# Patient Record
Sex: Female | Born: 1945 | Race: White | Hispanic: No | Marital: Married | Smoking: Former smoker
Health system: Southern US, Community
[De-identification: ages and names within clinical notes are randomized; demographics above are authoritative.]

## PROBLEM LIST (undated history)

## (undated) DIAGNOSIS — K219 Gastro-esophageal reflux disease without esophagitis: Secondary | ICD-10-CM

## (undated) DIAGNOSIS — I2510 Atherosclerotic heart disease of native coronary artery without angina pectoris: Secondary | ICD-10-CM

## (undated) DIAGNOSIS — T7840XA Allergy, unspecified, initial encounter: Secondary | ICD-10-CM

## (undated) DIAGNOSIS — K760 Fatty (change of) liver, not elsewhere classified: Secondary | ICD-10-CM

## (undated) DIAGNOSIS — R42 Dizziness and giddiness: Secondary | ICD-10-CM

## (undated) DIAGNOSIS — R079 Chest pain, unspecified: Secondary | ICD-10-CM

## (undated) DIAGNOSIS — H269 Unspecified cataract: Secondary | ICD-10-CM

## (undated) DIAGNOSIS — E785 Hyperlipidemia, unspecified: Secondary | ICD-10-CM

## (undated) DIAGNOSIS — K635 Polyp of colon: Secondary | ICD-10-CM

## (undated) DIAGNOSIS — E119 Type 2 diabetes mellitus without complications: Secondary | ICD-10-CM

## (undated) DIAGNOSIS — I10 Essential (primary) hypertension: Secondary | ICD-10-CM

## (undated) HISTORY — PX: CHOLECYSTECTOMY: SHX55

## (undated) HISTORY — DX: Essential (primary) hypertension: I10

## (undated) HISTORY — PX: BREAST EXCISIONAL BIOPSY: SUR124

## (undated) HISTORY — DX: Gastro-esophageal reflux disease without esophagitis: K21.9

## (undated) HISTORY — PX: APPENDECTOMY: SHX54

## (undated) HISTORY — PX: THYROIDECTOMY, PARTIAL: SHX18

## (undated) HISTORY — DX: Dizziness and giddiness: R42

## (undated) HISTORY — DX: Type 2 diabetes mellitus without complications: E11.9

## (undated) HISTORY — DX: Atherosclerotic heart disease of native coronary artery without angina pectoris: I25.10

## (undated) HISTORY — DX: Hyperlipidemia, unspecified: E78.5

## (undated) HISTORY — PX: ABDOMINAL HYSTERECTOMY: SHX81

## (undated) HISTORY — DX: Polyp of colon: K63.5

## (undated) HISTORY — DX: Fatty (change of) liver, not elsewhere classified: K76.0

## (undated) HISTORY — PX: COLONOSCOPY: SHX174

## (undated) HISTORY — DX: Unspecified cataract: H26.9

## (undated) HISTORY — PX: TONSILLECTOMY: SUR1361

## (undated) HISTORY — DX: Allergy, unspecified, initial encounter: T78.40XA

## (undated) HISTORY — DX: Chest pain, unspecified: R07.9

---

## 1998-09-03 ENCOUNTER — Other Ambulatory Visit: Admission: RE | Admit: 1998-09-03 | Discharge: 1998-09-03 | Payer: Self-pay | Admitting: *Deleted

## 1999-09-23 ENCOUNTER — Other Ambulatory Visit: Admission: RE | Admit: 1999-09-23 | Discharge: 1999-09-23 | Payer: Self-pay | Admitting: *Deleted

## 1999-11-01 ENCOUNTER — Encounter: Admission: RE | Admit: 1999-11-01 | Discharge: 1999-11-01 | Payer: Self-pay | Admitting: *Deleted

## 2000-06-14 ENCOUNTER — Ambulatory Visit (HOSPITAL_COMMUNITY): Admission: RE | Admit: 2000-06-14 | Discharge: 2000-06-14 | Payer: Self-pay | Admitting: *Deleted

## 2000-07-11 ENCOUNTER — Encounter: Payer: Self-pay | Admitting: General Surgery

## 2000-07-14 ENCOUNTER — Observation Stay (HOSPITAL_COMMUNITY): Admission: RE | Admit: 2000-07-14 | Discharge: 2000-07-15 | Payer: Self-pay | Admitting: General Surgery

## 2000-07-14 ENCOUNTER — Encounter (INDEPENDENT_AMBULATORY_CARE_PROVIDER_SITE_OTHER): Payer: Self-pay | Admitting: Specialist

## 2001-01-16 ENCOUNTER — Encounter: Admission: RE | Admit: 2001-01-16 | Discharge: 2001-01-16 | Payer: Self-pay | Admitting: Internal Medicine

## 2001-01-16 ENCOUNTER — Encounter: Payer: Self-pay | Admitting: Internal Medicine

## 2001-09-19 ENCOUNTER — Other Ambulatory Visit: Admission: RE | Admit: 2001-09-19 | Discharge: 2001-09-19 | Payer: Self-pay | Admitting: Family Medicine

## 2001-09-25 ENCOUNTER — Encounter: Admission: RE | Admit: 2001-09-25 | Discharge: 2001-09-25 | Payer: Self-pay | Admitting: Family Medicine

## 2001-09-25 ENCOUNTER — Encounter: Payer: Self-pay | Admitting: Family Medicine

## 2002-01-17 ENCOUNTER — Encounter: Admission: RE | Admit: 2002-01-17 | Discharge: 2002-01-17 | Payer: Self-pay | Admitting: Family Medicine

## 2002-01-17 ENCOUNTER — Encounter: Payer: Self-pay | Admitting: Family Medicine

## 2003-01-20 ENCOUNTER — Encounter: Payer: Self-pay | Admitting: Family Medicine

## 2003-01-20 ENCOUNTER — Encounter: Admission: RE | Admit: 2003-01-20 | Discharge: 2003-01-20 | Payer: Self-pay | Admitting: Family Medicine

## 2003-03-16 ENCOUNTER — Encounter: Admission: RE | Admit: 2003-03-16 | Discharge: 2003-03-16 | Payer: Self-pay

## 2003-06-30 ENCOUNTER — Ambulatory Visit (HOSPITAL_COMMUNITY): Admission: RE | Admit: 2003-06-30 | Discharge: 2003-06-30 | Payer: Self-pay | Admitting: Gastroenterology

## 2004-01-22 ENCOUNTER — Encounter: Admission: RE | Admit: 2004-01-22 | Discharge: 2004-01-22 | Payer: Self-pay | Admitting: Family Medicine

## 2004-02-03 ENCOUNTER — Encounter: Admission: RE | Admit: 2004-02-03 | Discharge: 2004-05-03 | Payer: Self-pay | Admitting: Family Medicine

## 2004-06-03 ENCOUNTER — Encounter: Admission: RE | Admit: 2004-06-03 | Discharge: 2004-06-03 | Payer: Self-pay | Admitting: Family Medicine

## 2004-09-01 ENCOUNTER — Encounter: Admission: RE | Admit: 2004-09-01 | Discharge: 2004-09-01 | Payer: Self-pay | Admitting: Family Medicine

## 2005-01-27 ENCOUNTER — Encounter: Admission: RE | Admit: 2005-01-27 | Discharge: 2005-01-27 | Payer: Self-pay | Admitting: Family Medicine

## 2005-09-10 ENCOUNTER — Encounter: Admission: RE | Admit: 2005-09-10 | Discharge: 2005-09-10 | Payer: Self-pay | Admitting: Family Medicine

## 2005-12-15 ENCOUNTER — Encounter: Admission: RE | Admit: 2005-12-15 | Discharge: 2005-12-15 | Payer: Self-pay | Admitting: Family Medicine

## 2005-12-23 ENCOUNTER — Encounter: Admission: RE | Admit: 2005-12-23 | Discharge: 2005-12-23 | Payer: Self-pay | Admitting: Family Medicine

## 2006-01-05 ENCOUNTER — Encounter: Admission: RE | Admit: 2006-01-05 | Discharge: 2006-01-05 | Payer: Self-pay | Admitting: Family Medicine

## 2006-01-26 ENCOUNTER — Other Ambulatory Visit: Admission: RE | Admit: 2006-01-26 | Discharge: 2006-01-26 | Payer: Self-pay | Admitting: Obstetrics and Gynecology

## 2006-07-10 ENCOUNTER — Ambulatory Visit (HOSPITAL_COMMUNITY): Admission: RE | Admit: 2006-07-10 | Discharge: 2006-07-10 | Payer: Self-pay | Admitting: Obstetrics and Gynecology

## 2007-01-08 ENCOUNTER — Encounter: Admission: RE | Admit: 2007-01-08 | Discharge: 2007-01-08 | Payer: Self-pay | Admitting: Family Medicine

## 2007-08-10 ENCOUNTER — Ambulatory Visit: Payer: Self-pay | Admitting: Internal Medicine

## 2007-10-11 ENCOUNTER — Ambulatory Visit: Payer: Self-pay | Admitting: Internal Medicine

## 2007-11-15 ENCOUNTER — Ambulatory Visit: Payer: Self-pay | Admitting: Internal Medicine

## 2008-01-14 ENCOUNTER — Encounter: Admission: RE | Admit: 2008-01-14 | Discharge: 2008-01-14 | Payer: Self-pay | Admitting: Family Medicine

## 2008-01-15 DIAGNOSIS — E785 Hyperlipidemia, unspecified: Secondary | ICD-10-CM | POA: Insufficient documentation

## 2008-01-15 DIAGNOSIS — E119 Type 2 diabetes mellitus without complications: Secondary | ICD-10-CM | POA: Insufficient documentation

## 2008-01-15 DIAGNOSIS — I10 Essential (primary) hypertension: Secondary | ICD-10-CM | POA: Insufficient documentation

## 2009-02-05 ENCOUNTER — Encounter: Admission: RE | Admit: 2009-02-05 | Discharge: 2009-02-05 | Payer: Self-pay | Admitting: Family Medicine

## 2009-09-30 ENCOUNTER — Ambulatory Visit: Payer: Self-pay | Admitting: Internal Medicine

## 2009-09-30 DIAGNOSIS — R198 Other specified symptoms and signs involving the digestive system and abdomen: Secondary | ICD-10-CM | POA: Insufficient documentation

## 2009-09-30 DIAGNOSIS — R1084 Generalized abdominal pain: Secondary | ICD-10-CM | POA: Insufficient documentation

## 2009-09-30 DIAGNOSIS — R1013 Epigastric pain: Secondary | ICD-10-CM | POA: Insufficient documentation

## 2010-02-08 ENCOUNTER — Encounter: Admission: RE | Admit: 2010-02-08 | Discharge: 2010-02-08 | Payer: Self-pay | Admitting: Family Medicine

## 2011-01-10 DIAGNOSIS — R079 Chest pain, unspecified: Secondary | ICD-10-CM

## 2011-01-10 HISTORY — DX: Chest pain, unspecified: R07.9

## 2011-02-21 ENCOUNTER — Other Ambulatory Visit: Payer: Self-pay | Admitting: Internal Medicine

## 2011-02-21 DIAGNOSIS — Z1231 Encounter for screening mammogram for malignant neoplasm of breast: Secondary | ICD-10-CM

## 2011-03-02 ENCOUNTER — Ambulatory Visit
Admission: RE | Admit: 2011-03-02 | Discharge: 2011-03-02 | Disposition: A | Discharge status: Home / Self Care | Payer: Medicare Other | Source: Ambulatory Visit | Attending: Internal Medicine | Admitting: Internal Medicine

## 2011-03-02 DIAGNOSIS — Z1231 Encounter for screening mammogram for malignant neoplasm of breast: Secondary | ICD-10-CM

## 2011-04-12 NOTE — Assessment & Plan Note (Signed)
[facility]                         GASTROENTEROLOGY OFFICE NOTE   NAME:Harmon, Rebecca KNOBLOCH                   MRN:          [id number]  DATE:10/11/2007                            DOB:          1946-04-03    HISTORY:  Rebecca Harmon presents today in followup.  She is a 65 year old  with a history of hypertension, dyslipidemia, and glucose intolerance.  Also a history of multiple abdominal surgeries.  She was evaluated in  the office August 10, 2007 as a new patient regarding abdominal pain.  Her pain was chronic and felt to be functional with significant  component of chronic constipation.  MiraLax for constipation was  prescribed.  As well, she was to have colonoscopy to evaluate and also  to provide neoplasia screening.  The patient states she took just 2  doses of MiraLax and then abandoned the therapy, stating it may have  given her some gas.  She did not schedule colonoscopy.  She scheduled an  office visit for today.  The patient's symptoms are essentially  unchanged from her last office visit.  No real change.   CURRENT MEDICATIONS:  Metoprolol.  Ramipril.  Hydrochlorothiazide.  Estradiol.  Aspirin.  Protegra.  Vitamin.  Fish oil.   PHYSICAL EXAM:  Well-appearing female in no acute distress.  Blood pressure 132/70, heart rate 68, weight 179 pounds.  ABDOMEN:  Soft without mass or significant tenderness.  Good bowel  sounds heard.   IMPRESSION:  1. Chronic abdominal pain, Harmon likely functional.  2. Chronic constipation.  3. Multiple abdominal surgeries.   RECOMMENDATIONS:  1. The patient is asked to resume MiraLax on a more regular basis and      titrate to need.  She is agreeable.  Improved bowel habits may      improve some of her abdominal complaints.  2. Again discussed the risks and benefits of colonoscopy.  She is      again agreeable to proceed.  The nature of the procedure, as well      as the risks, benefits, and alternatives  are discussed.  She      understood.  3. Ongoing general medical care with Dr. Elisabeth Harmon.     Wilhemina Bonito. Marina Goodell, MD  Electronically Signed    JNP/MedQ  DD: 10/12/2007  DT: 10/12/2007  Job #: [id number]   cc:   Rebecca Harmon, D.O.

## 2011-04-12 NOTE — Assessment & Plan Note (Signed)
[facility]                         GASTROENTEROLOGY OFFICE NOTE   NAME:Rebecca Harmon, Rebecca Harmon                   MRN:          [id number]  DATE:08/10/2007                            DOB:          04/20/1946    REFERRING PHYSICIAN:  Lovenia Kim, D.O.   REASON FOR CONSULTATION:  Abdominal pain.   HISTORY:  This is a 65 year old white female with a history of  hypertension, diabetes, and unspecified thyroid problems.  She has also  had multiple surgeries.  She presents today regarding chronic abdominal  pain of one to two years' duration.  The patient reports chronic low  abdominal discomfort, actually pelvic discomfort.  It seems to radiate  between the hips.  She states the discomfort is worse with constipation.  The discomfort lasts no longer than 5 minutes.  For this discomfort, she  underwent laparoscopy with lysis of adhesions approximately one year  ago.  This did not help.  Her discomfort can occur on a daily basis for  a week or so, or she can asymptomatic for one or two weeks.  Overall,  she thinks symptoms are worse.  She also has upper abdominal  discomfort  in the epigastric region, but she states it seems to be worse on an  empty stomach.  She did undergo an upper endoscopy with Dr. Charna Elizabeth  January 30, 2006.  This was said to be normal, except for mild antral  gastritis.  The patient has some nausea, though denies vomiting,  diarrhea, melena, change in appetite or change in weight.  She does have  bloating.  She has been on Nexium in the past without improvement of  symptoms.  Recent CT scan of the abdomen and pelvis with and without  contrast performed June 04, 2007, was unremarkable.   PAST MEDICAL HISTORY:  1. Hypertension.  2. Diabetes (diet controlled).  3. Hypertriglyceridemia.   PAST SURGICAL HISTORY:  1. Partial thyroidectomy.  2. Cholecystectomy.  3. Hysterectomy.  4. Tubal ligation.  5. Breast biopsy for benign  disease.  6. Appendectomy.  7. Repair of detached retina.   ALLERGIES:  NO KNOWN DRUG ALLERGIES.   CURRENT MEDICATIONS:  1. Metoprolol 100 mg daily.  2. Ramipril 10 mg daily.  3. Hydrochlorothiazide 12.5 mg daily.   FAMILY HISTORY:  Negative for gastrointestinal malignancy.  Mother with  diabetes.   SOCIAL HISTORY:  The patient is married with two sons.  She lives with  her husband.  She has a 12th grade education.  She works for [employer], does not smoke or use alcohol.   REVIEW OF SYSTEMS:  Per diagnostic evaluation form.   PHYSICAL EXAMINATION:  GENERAL:  Well-appearing female, no acute  distress.  VITAL SIGNS:  Blood pressure is 144/78, heart rate 78, weight is 178.4  pounds.  She is 5 feet 3 inches in height.  HEENT:  Sclerae anicteric, conjunctivae are pink.  Oral mucosa is  intact, no adenopathy.  LUNGS:  Clear to auscultation and percussion.  HEART:  Regular.  ABDOMEN:  Soft without significant tenderness.  There is no mass or  hernia.  Previous surgical incisions are well healed.  RECTAL:  Deferred.  EXTREMITIES:  Without edema.   IMPRESSION:  1. Chronic lower abdominal/pelvic pain of uncertain etiology.  Suspect      functional.  Question irritable bowel, question adhesions.  2. Intermittent epigastric pain, status post cholecystectomy.  No      improvement on proton pump inhibitor.  Suspect functional.  3. Chronic constipation.  4. General medical problems and prior surgeries as discussed.   RECOMMENDATIONS:  1. MiraLax for constipation.  2. Colonoscopy to evaluate pain and provide neoplasia screening, as it      has been close to 10 years since her last exam.  The nature of the      procedure, as well as the risks, benefits and alternatives have      been reviewed.  She understood and agreed to proceed.  3. Consider antispasmodics.  4. Ongoing general medical care with Dr. Elisabeth Most.     Wilhemina Bonito. Marina Goodell, MD  Electronically  Signed    JNP/MedQ  DD: 08/13/2007  DT: 08/14/2007  Job #: [id number]   cc:   Lovenia Kim, D.O.

## 2011-04-15 NOTE — Discharge Summary (Signed)
[facility]  Patient:    KYSA, CALAIS                   MRN: [id number] Adm. Date:  [id number] Disc. Date: [id number] Attending:  Caleen Essex                           Discharge Summary  HISTORY OF PRESENT ILLNESS AND HOSPITAL COURSE:  For details of Ms. Coltranes history and physical, please see her dictated H&P.  In summary, she is a 65 year old lady with symptomatic cholelithiasis who was brought to the operating room on July 14, 2000, for a laparoscopic cholecystectomy.  She tolerated this procedure well.  Postoperatively, she was able to tolerate a diet.  She was ambulating and urinating without difficulty.  By the next morning, she was ready for discharge to home.  DISCHARGE MEDICATIONS:  She is to resume her home medicines as well as taking Vicodin 1-2 p.o. q.4-6h. p.r.n. for pain.  ACTIVITY:  No heavy lifting for the next several weeks.  DIET:  Regular.  CONDITION ON DISCHARGE:  Stable.  FOLLOW-UP:  She is to call for a follow-up appointment in the next two weeks. D:  07/16/00 TD:  07/17/00 Job: [id number]

## 2011-04-15 NOTE — Op Note (Signed)
Rebecca Harmon, SUMNERS            ACCOUNT NO.:  [id number]   MEDICAL RECORD NO.:  [id number]          PATIENT TYPE:  AMB   LOCATION:  SDC                           FACILITY:  [facility]   PHYSICIAN:  Dois Davenport A. Rivard, M.D. DATE OF BIRTH:  30-Aug-1946   DATE OF PROCEDURE:  07/10/2006  DATE OF DISCHARGE:                                 OPERATIVE REPORT   PREOPERATIVE DIAGNOSIS:  Pelvic pain.   POSTOPERATIVE DIAGNOSIS:  Pelvic pain with pelvic adhesions.   PROCEDURE:  Laparoscopy with lysis of adhesions.   SURGEON:  Dr. Estanislado Pandy.   No assistant.   ESTIMATED BLOOD LOSS:  Minimal.   No specimen.   PROCEDURE:  After being informed of the planned procedure with possible  complications including bleeding, infection, injury to other organs and need  for laparotomy, informed consent is obtained.  The patient is taken to OR  #3, given general anesthesia with endotracheal intubation.  She is placed in  the lithotomy position, prepped and draped in a sterile fashion.  Her  bladder is emptied with an in-and-out red rubber catheter.  Pelvic exam  reveals absence of uterus and two normal adnexa.  No pelvic masses were  noted.  We infiltrated the umbilical area using 5 mL of Marcaine 0.25, and  perform a semi-elliptical incision which was brought down bluntly to the  fascia.  The fascia is identified and grasped with Kocher forceps and opened  with scissors.  Peritoneum is entered bluntly with hemostat clamp.  A  pursestring suture of 0-0 Vicryl is placed on the fascia and a Hasson trocar  is inserted easily, tied with a pursestring suture.  This allows [place] to  achieve a pneumoperitoneum with CO2 at a maximum pressure of 15 mmHg.  A 5-  mm trocar is then inserted suprapubically, after infiltrating with 3 mL of  Marcaine 0.25, and this was done overlooking the previous Pfannenstiel  incision.   OBSERVATION:  The cul-de-sac is completely obliterated by pelvic adhesions  with pericolic fat.  The right  tube is identified with a small hydrosalpinx,  measuring 1.5 cm, and the right ovary is plastered to the pelvic wall and to  the pericolic fat by an adhesion process.  We note adhesion in the right  paraumbilical area, pulling on the large bowel toward the anterior abdominal  wall.  Liver is visualized and normal.  We also of note some adhesions  between the sigmoid colon and the left pelvic wall, which hinders our  ability to see the left ovary.  A right lower quadrant 5-mm trocar is  inserted under direct visualization, after infiltrating with 2 mL of  Marcaine 0.25, and we proceed with sharp dissection of the adhesions in the  right periumbilical area, using monopolar scissors and countertraction.  This is done always keeping the bowel under direct visualization, and we are  able to completely abate these adhesions.  Hemostasis is adequate.  We then  proceed with lysis of adhesions over the sigmoid colon in the left pelvic  wall, and the colon was completely freed, but unfortunately, we are unable  to free the  adnexal area due to dense adhesion and its proximity to the left  ureter.  We then proceed with lysis of adhesions, sharply again, over the  right ovary so that the right ovary is completely free.   We then irrigate profusely with warm saline and perform hemostasis with  cauterization over of the left pericolic fat.  Hemostasis is then adequate.   The 5-mm trocars are then removed, under direct visualization, and the  umbilical trocar is removed after evacuating the pneumoperitoneum.  The  pursestring suture is tied, after confirming absence of bowel loops  protruding, and the skin of all incisions is then closed with subcuticular  suture of 4-0 Vicryl.  Instrument and sponge count is complete x2.  Estimated blood loss is minimal.  The procedure is very well tolerated by  the patient who is taken to recovery room in a well and stable condition.      Crist Fat Rivard, M.D.   Electronically Signed     SAR/MEDQ  D:  07/10/2006  T:  07/10/2006  Job:  [id number]

## 2011-04-15 NOTE — Op Note (Signed)
[facility]  Patient:    Rebecca Harmon, Rebecca Harmon                   MRN: [id number] Proc. Date: 07/14/00 Adm. Date:  [id number] Disc. Date: [id number] Attending:  Caleen Essex                           Operative Report  PREOPERATIVE DIAGNOSIS:  Symptomatic cholelithiasis.  POSTOPERATIVE DIAGNOSIS:  Symptomatic cholelithiasis.  PROCEDURE:  Laparoscopic cholecystectomy.  SURGEON:  Chevis Pretty, M.D.  ASSISTANT:  Adolph Pollack, M.D.  ANESTHESIA:  General endotracheal.  DESCRIPTION OF PROCEDURE:  After informed consent was obtained, the patient was brought to the operating room and placed in the supine position on the operating room table.  After adequate induction of general anesthesia, the patients ________ was prepped with Betadine and draped in the usual sterile manner.  A transverse supraumbilical incision was made with a 15 blade knife. This was carried down to the anterior abdominal fascia bluntly using a Tresa Endo and a [employer] retractors.  Once the fascia of the anterior abdominal wall was encountered, it was incised with a 15 blade knife, and each side was grasped with the Kocher clamps.  This area was then probed bluntly with a hemostat until entry was gained to the abdominal cavity.  A 0 Vicryl pursestring suture was placed around the sole in the fascia and an Hasson cannula was inserted through the sole, and the laparoscope was placed through this.  A small upper midline incision was made with the 15 blade knife and the 10 mm port was placed through this into the abdominal cavity under direct vision using the laparoscope.  Two small transverse incisions were made laterally on the right side of the abdomen with a 15 blade knife and two small 5 mm ports were placed through these holes into the abdominal cavity, again under direct vision using the laparoscope.  A blunt retractor was placed was placed through the lateral most 5 mm port and used to  grasp the dome of the gallbladder and elevated anteriorly and superiorly.  Another blunt grasper was placed through the other lateral port and used to grasp the body and the neck of the gallbladder for lateral retraction.  A [place] dissector was placed through the 10 mm port and used to bluntly dissect the peritoneal reflection over top of the neck of the gallbladder until the cystic duct and gallbladder junction was identified.  Once this was identified, the cystic duct was dissected circumferentially in a blunt manner at this area.  Three clips were placed proximally and one distally on the cystic duct, taking care to keep the common duct medial to all this, and the cystic duct was divided between the two.  The cystic duct artery was then identified posterior to this and the two clips were placed proximally and one distally, and the cystic artery was divided between the two.  Next, a hook cautery was used to dissect sharply the gallbladder from the liver bed.  Once this was complete, the laparoscope was moved to the upper midline port, and the gallbladder grasper was placed through the Hasson cannula and used to grasp the neck of the gallbladder.  The gallbladder was then removed under direct vision through the umbilical port.  The Hasson cannula was replaced and the laparoscope was moved back to the Hasson port. The abdomen was inspected and the liver bed was found to  be hemostatic and was irrigated with saline until the _______ was clear.  All of the cannulas were then removed under direct vision.  The supraumbilical fascia was then closed with a previously placed pursestring Vicryl stitch.  The skin was closed with interrupted 4-0 Monocryl subcuticular stitches.  Benzoin and Steri-Strips were applied.  The patient tolerated the procedure well.  At the end of the case, all needle, sponge, and instrument counts were correct.  The patient was awakened and taken to the recovery room in  stable condition. DD:  09/22/00 TD:  09/23/00 Job: [id number]

## 2011-04-15 NOTE — Op Note (Signed)
NAME:  Rebecca Harmon, Rebecca Harmon                      ACCOUNT NO.:  [id number]   MEDICAL RECORD NO.:  [id number]                   PATIENT TYPE:  AMB   LOCATION:  ENDO                                 FACILITY:  [facility]   PHYSICIAN:  Anselmo Rod, M.D.               DATE OF BIRTH:  [birth year]   DATE OF PROCEDURE:  06/30/2003  DATE OF DISCHARGE:                                 OPERATIVE REPORT   PROCEDURE PERFORMED:  Screening colonoscopy.   ENDOSCOPIST:  Anselmo Rod, M.D.   INSTRUMENT USED:  Olympus video colonoscope.   INDICATION FOR PROCEDURE:  A 65 year old white female with episodic blood in  stool (small specks of blood seen on three occasions in the last five  months).  Rule out colonic polyps, masses, hemorrhoids, etc.   PREPROCEDURE PREPARATION:  Informed consent was procured from the patient.  The patient was fasted for eight hours prior to the procedure and prepped  with a bottle of magnesium citrate and a gallon of GoLYTELY the night prior  to the procedure.   PREPROCEDURE PHYSICAL:  VITAL SIGNS:  The patient had stable vital signs.  NECK:  Supple.  CHEST:  Clear to auscultation.  S1, S2 regular.  ABDOMEN:  Soft with normal bowel sounds.   DESCRIPTION OF PROCEDURE:  The patient was placed in the left lateral  decubitus position and sedated with 70 mg of Demerol and 7 mg of Versed  intravenously.  Once the patient was adequately sedate and maintained on low-  flow oxygen and continuous cardiac monitoring, the Olympus video colonoscope  was advanced from the rectum to the cecum and terminal ileum without  difficulty.  The patient had a fairly good prep.  No masses, polyps,  erosions, ulcerations, or diverticula were seen.  The appendiceal orifice  and the ileocecal valve were visualized and photographed.  The terminal  ileum appeared healthy and without lesions.  Retroflexion in the rectum  revealed no abnormalities.   IMPRESSION:  Normal colonoscopy up to the  terminal ileum.   RECOMMENDATIONS:  1. No source of bleeding has been identified at this point, and therefore     the patient has been advised to maintain a diary with regard to her     symptoms.  If she continues to see blood in the stool, an EGD and small     bowel follow-through will be considered in the future.  2.     Outpatient follow-up in the next two weeks.  3. Repeat colorectal cancer screening in the next 10 years unless the     patient develops any abnormal symptoms in the interim.                                               Anselmo Rod, M.D.  JNM/MEDQ  D:  06/30/2003  T:  06/30/2003  Job:  [id number]   cc:   Christella Noa, M.D.  [address]  [place], [place] [address]  Fax: [phone]

## 2012-02-13 ENCOUNTER — Other Ambulatory Visit: Payer: Self-pay | Admitting: Internal Medicine

## 2012-02-13 DIAGNOSIS — Z1231 Encounter for screening mammogram for malignant neoplasm of breast: Secondary | ICD-10-CM

## 2012-03-02 ENCOUNTER — Ambulatory Visit
Admission: RE | Admit: 2012-03-02 | Discharge: 2012-03-02 | Disposition: A | Discharge status: Home / Self Care | Payer: Medicare Other | Source: Ambulatory Visit | Attending: Internal Medicine | Admitting: Internal Medicine

## 2012-03-02 DIAGNOSIS — Z1231 Encounter for screening mammogram for malignant neoplasm of breast: Secondary | ICD-10-CM

## 2013-02-26 ENCOUNTER — Other Ambulatory Visit (HOSPITAL_COMMUNITY): Payer: Self-pay | Admitting: Physician Assistant

## 2013-02-26 DIAGNOSIS — E1059 Type 1 diabetes mellitus with other circulatory complications: Secondary | ICD-10-CM

## 2013-02-26 DIAGNOSIS — R079 Chest pain, unspecified: Secondary | ICD-10-CM

## 2013-02-26 DIAGNOSIS — I10 Essential (primary) hypertension: Secondary | ICD-10-CM

## 2013-03-06 ENCOUNTER — Ambulatory Visit (HOSPITAL_COMMUNITY)
Admission: RE | Admit: 2013-03-06 | Discharge: 2013-03-06 | Disposition: A | Discharge status: Home / Self Care | Payer: Medicare Other | Source: Ambulatory Visit | Attending: Physician Assistant | Admitting: Physician Assistant

## 2013-03-06 DIAGNOSIS — R5381 Other malaise: Secondary | ICD-10-CM | POA: Insufficient documentation

## 2013-03-06 DIAGNOSIS — R0609 Other forms of dyspnea: Secondary | ICD-10-CM | POA: Insufficient documentation

## 2013-03-06 DIAGNOSIS — R42 Dizziness and giddiness: Secondary | ICD-10-CM | POA: Insufficient documentation

## 2013-03-06 DIAGNOSIS — R5383 Other fatigue: Secondary | ICD-10-CM | POA: Insufficient documentation

## 2013-03-06 DIAGNOSIS — E1059 Type 1 diabetes mellitus with other circulatory complications: Secondary | ICD-10-CM

## 2013-03-06 DIAGNOSIS — I10 Essential (primary) hypertension: Secondary | ICD-10-CM

## 2013-03-06 DIAGNOSIS — R079 Chest pain, unspecified: Secondary | ICD-10-CM | POA: Insufficient documentation

## 2013-03-06 DIAGNOSIS — R002 Palpitations: Secondary | ICD-10-CM | POA: Insufficient documentation

## 2013-03-06 DIAGNOSIS — R0989 Other specified symptoms and signs involving the circulatory and respiratory systems: Secondary | ICD-10-CM | POA: Insufficient documentation

## 2013-03-06 HISTORY — DX: Dizziness and giddiness: R42

## 2013-03-06 MED ORDER — TECHNETIUM TC 99M SESTAMIBI GENERIC - CARDIOLITE
30.0000 | Freq: Once | INTRAVENOUS | Status: AC | PRN
  Administered 2013-03-06: 30 via INTRAVENOUS

## 2013-03-06 MED ORDER — TECHNETIUM TC 99M SESTAMIBI GENERIC - CARDIOLITE
10.0000 | Freq: Once | INTRAVENOUS | Status: AC | PRN
  Administered 2013-03-06: 10 via INTRAVENOUS

## 2013-03-06 NOTE — Procedures (Addendum)
 [facility] [address] [place] [address] [phone]  Cardiology Nuclear Med Study  Rebecca Harmon is a 67 y.o. female     MRN : [id number]     DOB: 1946-07-06  Procedure Date: 03/06/2013  Nuclear Med Background Indication for Stress Test:  Evaluation for Ischemia History:  NO PRIOR CARDIAC HISTORY Cardiac Risk Factors: Family History - CAD, History of Smoking, Hypertension, Lipids, NIDDM and Obesity  Symptoms:  Chest Pain, Dizziness, DOE, Fatigue, Light-Headedness and Palpitations   Nuclear Pre-Procedure Caffeine/Decaff Intake:  7:00pm NPO After: 5:00am   IV Site: R Forearm  IV 0.9% NS with Angio Cath:  22g  Chest Size (in):  N/A IV Started by: Emmit Pomfret, RN  Height: 5\' 3"  (1.6 m)  Cup Size: D  BMI:  Body mass index is 30.83 kg/(m^2). Weight:  174 lb (78.926 kg)   Tech Comments:  N/A    Nuclear Med Study 1 or 2 day study: 1 day  Stress Test Type:  Stress  Order Authorizing Provider:  Bryan Lemma, MD   Resting Radionuclide: Technetium 16m Sestamibi  Resting Radionuclide Dose: 10.5 mCi   Stress Radionuclide:  Technetium 35m Sestamibi  Stress Radionuclide Dose: 30.1 mCi           Stress Protocol Rest HR100 Stress HR: 171  Rest BP: 157/99 Stress BP: 196/89  Exercise Time (min): 6:36 METS: 7.90   Predicted Max HR: 152 bpm % Max HR: 112.5 bpm Rate Pressure Product: 64403  Dose of Adenosine (mg):  n/a Dose of Lexiscan: n/a mg  Dose of Atropine (mg): n/a Dose of Dobutamine: n/a mcg/kg/min (at max HR)  Stress Test Technologist: Ernestene Mention, CCT Nuclear Technologist: Koren Shiver, CNMT   Rest Procedure:  Myocardial perfusion imaging was performed at rest 45 minutes following the intravenous administration of Technetium 41m Sestamibi. Stress Procedure:  The patient performed treadmill exercise using a Bruce  Protocol for 6 minutes. The patient stopped due to fatigue. Patient denied any chest pain.   There were no significant ST-T wave changes.  Technetium 85m Sestamibi was injected at peak exercise and myocardial perfusion imaging was performed after a brief delay.  Transient Ischemic Dilatation (Normal <1.22):  0.82 Lung/Heart Ratio (Normal <0.45):  0.34 QGS EDV:  28 ml QGS ESV:  5 ml LV Ejection Fraction: 81%  Signed by      Rest ECG: NSR - Normal EKG  Stress ECG: No significant change from baseline ECG  QPS Raw Data Images:  Normal; no motion artifact; normal heart/lung ratio. Stress Images:  Normal homogeneous uptake in all areas of the myocardium. Rest Images:  Normal homogeneous uptake in all areas of the myocardium. Subtraction (SDS):  No evidence of ischemia.  Impression Exercise Capacity:  Good exercise capacity. BP Response:  Normal blood pressure response. Clinical Symptoms:  No significant symptoms noted. ECG Impression:  No significant ST segment change suggestive of ischemia. Comparison with Prior Nuclear Study: No significant change from previous study  Overall Impression:  Normal stress nuclear study.  LV Wall Motion:  NL LV Function; NL Wall Motion   Runell Gess, MD  03/06/2013 3:21 PM

## 2013-10-09 ENCOUNTER — Other Ambulatory Visit: Payer: Self-pay

## 2013-10-09 DIAGNOSIS — Z1231 Encounter for screening mammogram for malignant neoplasm of breast: Secondary | ICD-10-CM

## 2013-10-11 ENCOUNTER — Encounter: Payer: Self-pay | Admitting: Internal Medicine

## 2013-10-11 ENCOUNTER — Ambulatory Visit (HOSPITAL_COMMUNITY): Payer: Medicare Other | Attending: Internal Medicine

## 2013-10-11 DIAGNOSIS — E1159 Type 2 diabetes mellitus with other circulatory complications: Secondary | ICD-10-CM | POA: Insufficient documentation

## 2013-10-11 DIAGNOSIS — E785 Hyperlipidemia, unspecified: Secondary | ICD-10-CM | POA: Insufficient documentation

## 2013-10-11 DIAGNOSIS — I798 Other disorders of arteries, arterioles and capillaries in diseases classified elsewhere: Secondary | ICD-10-CM | POA: Insufficient documentation

## 2013-10-11 DIAGNOSIS — I10 Essential (primary) hypertension: Secondary | ICD-10-CM | POA: Insufficient documentation

## 2013-10-11 DIAGNOSIS — I739 Peripheral vascular disease, unspecified: Secondary | ICD-10-CM

## 2013-11-13 ENCOUNTER — Ambulatory Visit
Admission: RE | Admit: 2013-11-13 | Discharge: 2013-11-13 | Disposition: A | Discharge status: Home / Self Care | Payer: Medicare HMO | Source: Ambulatory Visit

## 2013-11-13 DIAGNOSIS — Z1231 Encounter for screening mammogram for malignant neoplasm of breast: Secondary | ICD-10-CM

## 2014-04-09 ENCOUNTER — Other Ambulatory Visit: Payer: Self-pay | Admitting: Internal Medicine

## 2014-04-09 DIAGNOSIS — R109 Unspecified abdominal pain: Secondary | ICD-10-CM

## 2014-04-11 ENCOUNTER — Ambulatory Visit
Admission: RE | Admit: 2014-04-11 | Discharge: 2014-04-11 | Disposition: A | Discharge status: Home / Self Care | Payer: Medicare HMO | Source: Ambulatory Visit | Attending: Internal Medicine | Admitting: Internal Medicine

## 2014-04-11 DIAGNOSIS — R109 Unspecified abdominal pain: Secondary | ICD-10-CM

## 2014-04-11 MED ORDER — IOHEXOL 300 MG/ML  SOLN
30.0000 mL | Freq: Once | INTRAMUSCULAR | Status: AC | PRN
  Administered 2014-04-11: 30 mL via ORAL

## 2014-04-11 MED ORDER — IOHEXOL 300 MG/ML  SOLN
100.0000 mL | Freq: Once | INTRAMUSCULAR | Status: AC | PRN
  Administered 2014-04-11: 100 mL via INTRAVENOUS

## 2015-02-02 ENCOUNTER — Other Ambulatory Visit: Payer: Self-pay

## 2015-02-02 DIAGNOSIS — Z1231 Encounter for screening mammogram for malignant neoplasm of breast: Secondary | ICD-10-CM

## 2015-02-11 ENCOUNTER — Ambulatory Visit
Admission: RE | Admit: 2015-02-11 | Discharge: 2015-02-11 | Disposition: A | Discharge status: Home / Self Care | Payer: Medicare HMO | Source: Ambulatory Visit

## 2015-02-11 DIAGNOSIS — Z1231 Encounter for screening mammogram for malignant neoplasm of breast: Secondary | ICD-10-CM

## 2015-06-12 ENCOUNTER — Encounter: Payer: Self-pay | Admitting: *Deleted

## 2015-06-19 ENCOUNTER — Encounter: Payer: Self-pay | Admitting: Cardiovascular Disease

## 2015-07-10 ENCOUNTER — Encounter: Payer: Self-pay | Admitting: Gastroenterology

## 2015-07-28 ENCOUNTER — Ambulatory Visit (INDEPENDENT_AMBULATORY_CARE_PROVIDER_SITE_OTHER): Payer: Medicare HMO | Admitting: Gastroenterology

## 2015-07-28 ENCOUNTER — Encounter: Payer: Self-pay | Admitting: Gastroenterology

## 2015-07-28 VITALS — BP 138/90 | HR 88 | Ht 63.0 in | Wt 161.4 lb

## 2015-07-28 DIAGNOSIS — R194 Change in bowel habit: Secondary | ICD-10-CM | POA: Diagnosis not present

## 2015-07-28 DIAGNOSIS — K219 Gastro-esophageal reflux disease without esophagitis: Secondary | ICD-10-CM

## 2015-07-28 DIAGNOSIS — R1013 Epigastric pain: Secondary | ICD-10-CM

## 2015-07-28 DIAGNOSIS — R109 Unspecified abdominal pain: Secondary | ICD-10-CM

## 2015-07-28 DIAGNOSIS — G8929 Other chronic pain: Secondary | ICD-10-CM

## 2015-07-28 MED ORDER — NA SULFATE-K SULFATE-MG SULF 17.5-3.13-1.6 GM/177ML PO SOLN: 1.0000 | Freq: Once | ORAL | Status: DC

## 2015-07-28 NOTE — Patient Instructions (Signed)
You have been scheduled for an endoscopy and colonoscopy. Please follow the written instructions given to you at your visit today. Please pick up your prep supplies at the pharmacy within the next 1-3 days. If you use inhalers (even only as needed), please bring them with you on the day of your procedure. Your physician has requested that you go to www.startemmi.com and enter the access code given to you at your visit today. This web site gives a general overview about your procedure. However, you should still follow specific instructions given to you by our office regarding your preparation for the procedure.  Start Metamucil daily Use Imodium as needed  Check with PCP to see if Metformin could be causing your diarrhea

## 2015-09-01 DIAGNOSIS — R194 Change in bowel habit: Secondary | ICD-10-CM | POA: Insufficient documentation

## 2015-09-01 DIAGNOSIS — K219 Gastro-esophageal reflux disease without esophagitis: Secondary | ICD-10-CM | POA: Insufficient documentation

## 2015-09-01 DIAGNOSIS — G8929 Other chronic pain: Secondary | ICD-10-CM | POA: Insufficient documentation

## 2015-09-01 DIAGNOSIS — R109 Unspecified abdominal pain: Secondary | ICD-10-CM

## 2015-09-01 NOTE — Progress Notes (Signed)
Agree with initial assessment and plans as outlined 

## 2015-09-01 NOTE — Progress Notes (Signed)
07/28/2015 Rebecca Harmon [id number] 1946-06-05   HISTORY OF PRESENT ILLNESS:  This is a pleasant 69 year old female who is previously known to Dr. Henrene Pastor for colonoscopy in 10/2007 at which time she was found to have a normal exam with repeat recommended in 10 years from that time.  She has PMH of HTN, HLD, and DM.  She presents to our office today with complaints of change in bowel habits.  Has alternating diarrhea and constipation.  Says that the diarrhea stuff started about one year ago.  Sometimes she has diarrhea several times in a day and then other times she is constipated but then it starts all over again.she has urgency with the loose stools.  Has only seen red blood on one occasion but sees mucus at times as well.  Also complains of lower abdominal pain that is somewhat chronic.  She is on Metformin for several years and is s/p cholecystectomy.  Also complains of a lot of reflux and epigastric abdominal pain.  Is on omeprazole 40 mg daily but symptoms present despite the medication.  She was seen in 2010 by Dr. Henrene Pastor for all of the exact same complaints.  At that time she was just started on PPI for UGI symptoms as well as fiber supplement and probiotic for bowel issues.  Other abdominal pain was thought to be likely due to adhesions.  Was told to follow-up in 4-6 weeks but we are just now seeing her again, 6 years later.  CT scan of the abdomen and pelvis with contrast last year showed only mild diffuse fatty liver.    Past Medical History  Diagnosis Date  . Chest pain 2-13 2012    echo EF 50-55% read by Dr. Claiborne Billings pt. of Dr. Ellyn Hack  . Dizziness 03-06-2013    stress test Read by Dr. Gwenlyn Found, normal stress test  . Hypertension   . Diabetes mellitus without complication   . Hyperlipidemia    Past Surgical History  Procedure Laterality Date  . Appendectomy    . Cholecystectomy    . Tonsillectomy      x2  . Thyroidectomy, partial    . Abdominal hysterectomy      reports that she has quit smoking. She has never used smokeless tobacco. She reports that she does not drink alcohol or use illicit drugs. family history includes Diabetes in her mother and sister; Heart disease in her father. Allergies  Allergen Reactions  . Tetanus Toxoid   . Vytorin [Ezetimibe-Simvastatin] Other (See Comments)    UNSPECIFIED      Outpatient Encounter Prescriptions as of 07/28/2015  Medication Sig  . gabapentin (NEURONTIN) 100 MG capsule Take 100 mg by mouth as needed.  . hydrochlorothiazide (HYDRODIURIL) 25 MG tablet   . metFORMIN (GLUCOPHAGE) 1000 MG tablet   . metoprolol succinate (TOPROL-XL) 100 MG 24 hr tablet   . omeprazole (PRILOSEC) 40 MG capsule   . Na Sulfate-K Sulfate-Mg Sulf (SUPREP BOWEL PREP) SOLN Take 1 kit by mouth once.   No facility-administered encounter medications on file as of 07/28/2015.     REVIEW OF SYSTEMS  : All other systems reviewed and negative except where noted in the History of Present Illness.   PHYSICAL EXAM: BP 138/90 mmHg  Pulse 88  Ht 5' 3"  (1.6 m)  Wt 161 lb 6.4 oz (73.211 kg)  BMI 28.60 kg/m2 General: Well developed white female in no acute distress Head: Normocephalic and atraumatic Eyes:  Sclerae anicteric, conjunctiva pink. Ears:  Normal auditory acuity Lungs: Clear throughout to auscultation Heart: Regular rate and rhythm Abdomen: Soft, non-distended.  Normal bowel sounds.  Mild diffuse TTP but abdominal exam benign. Rectal:  Will be done at the time of colonoscopy. Musculoskeletal: Symmetrical with no gross deformities  Skin: No lesions on visible extremities Extremities: No edema  Neurological: Alert oriented x 4, grossly non-focal Psychological:  Alert and cooperative. Normal mood and affect  ASSESSMENT AND PLAN: -Epigastric abdominal pain and GERD:  On omeprazole 40 mg daily and still having symptoms.  Will schedule EGD for evaluation since she has never undergone one of those in the past. -Change in bowel  habits with alternating constipation and diarrhea:  Tending more towards diarrhea/loose stools with urgency now.  She will check with her PCP to see if maybe the Metformin is contributing to her loose stools.  Will try daily fiber supplement to help bulk the stools but not cause much more constipation.  She likely has some IBS, but last colonoscopy was 8 years ago so will schedule that with Dr. Henrene Pastor as well.  Can use Imodium prn for now as well. -Abdominal pain:  Lower/pelvic, chronic.  Previously thought to be due to adhesions.  CT scan last year showed no pain causing issues.   *The risks, benefits, and alternatives to EGD and colonoscopy were discussed with the patient and she consents to proceed.    CC:  No ref. provider found

## 2015-09-29 ENCOUNTER — Ambulatory Visit (AMBULATORY_SURGERY_CENTER): Payer: Medicare HMO | Admitting: Internal Medicine

## 2015-09-29 ENCOUNTER — Encounter: Payer: Self-pay | Admitting: Internal Medicine

## 2015-09-29 VITALS — BP 123/77 | HR 78 | Temp 97.9°F | Resp 17 | Ht 63.0 in | Wt 161.0 lb

## 2015-09-29 DIAGNOSIS — I10 Essential (primary) hypertension: Secondary | ICD-10-CM | POA: Diagnosis not present

## 2015-09-29 DIAGNOSIS — R1013 Epigastric pain: Secondary | ICD-10-CM

## 2015-09-29 DIAGNOSIS — K219 Gastro-esophageal reflux disease without esophagitis: Secondary | ICD-10-CM | POA: Diagnosis not present

## 2015-09-29 DIAGNOSIS — Z1211 Encounter for screening for malignant neoplasm of colon: Secondary | ICD-10-CM | POA: Diagnosis not present

## 2015-09-29 DIAGNOSIS — D122 Benign neoplasm of ascending colon: Secondary | ICD-10-CM

## 2015-09-29 DIAGNOSIS — R194 Change in bowel habit: Secondary | ICD-10-CM

## 2015-09-29 DIAGNOSIS — R69 Illness, unspecified: Secondary | ICD-10-CM | POA: Diagnosis not present

## 2015-09-29 DIAGNOSIS — E119 Type 2 diabetes mellitus without complications: Secondary | ICD-10-CM | POA: Diagnosis not present

## 2015-09-29 MED ORDER — SODIUM CHLORIDE 0.9 % IV SOLN: 500.0000 mL | INTRAVENOUS | Status: DC

## 2015-09-29 NOTE — Op Note (Signed)
[place]  [address] [place], [id number]   COLONOSCOPY PROCEDURE REPORT  PATIENT: Rebecca Harmon, [place]  MR#: [id number] BIRTHDATE: Oct 11, 1946 , 56  yrs. old GENDER: female ENDOSCOPIST: Eustace Quail, MD REFERRED BY:.  Self / Office PROCEDURE DATE:  09/29/2015 PROCEDURE:   Colonoscopy with snare polypectomy x 1 First Screening Colonoscopy - Avg.  risk and is 50 yrs.  old or older - No.  Prior Negative Screening - Now for repeat screening. N/A  History of Adenoma - Now for follow-up colonoscopy & has been > or = to 3 yrs.  N/A  Polyps removed today? Yes ASA CLASS:   Class II INDICATIONS:change in bowel habits and abdominal pain.   Marland Kitchen Previous examination December 2008 (normal) MEDICATIONS: Monitored anesthesia care and Propofol 200 mg IV  DESCRIPTION OF PROCEDURE:   After the risks benefits and alternatives of the procedure were thoroughly explained, informed consent was obtained.  The digital rectal exam revealed no abnormalities of the rectum.   The LB [id number]  endoscope was introduced through the anus and advanced to the cecum, which was identified by both the appendix and ileocecal valve. No adverse events experienced.   The quality of the prep was excellent. (Suprep was used)  The instrument was then slowly withdrawn as the colon was fully examined. Estimated blood loss is zero unless otherwise noted in this procedure report.   COLON FINDINGS: A single polyp measuring 5 mm in size was found in the ascending colon.  A polypectomy was performed with a cold snare.  The resection was complete, the polyp tissue was completely retrieved and sent to histology.   The examination was otherwise normal.  Retroflexed views revealed no abnormalities. The time to cecum = 3.9 Withdrawal time = 8.0   The scope was withdrawn and the procedure completed. COMPLICATIONS: There were no immediate complications.  ENDOSCOPIC IMPRESSION: 1.   Single polyp was found  in the ascending colon; polypectomy was performed with a cold snare 2.   The examination was otherwise normal  RECOMMENDATIONS: 1.  Repeat colonoscopy in 5 years if polyp adenomatous; otherwise 10 years 2.  Upper endoscopy today (please see report)  eSigned:  Eustace Quail, MD 09/29/2015 2:05 PM   cc: The Patient ; I. Shamleffer,MD ([place])

## 2015-09-29 NOTE — Progress Notes (Signed)
Transferred to recovery room. A/O x3, pleased with MAC.  VSS.  Report to [place]. RN.

## 2015-09-29 NOTE — Op Note (Signed)
[place]  [address] [place], [id number]   ENDOSCOPY PROCEDURE REPORT  PATIENT: Rebecca, Harmon  MR#: [id number] BIRTHDATE: 1946/10/24 , 28  yrs. old GENDER: female ENDOSCOPIST: Eustace Quail, MD REFERRED BY:  .  Self / Office PROCEDURE DATE:  09/29/2015 PROCEDURE:  EGD, diagnostic ASA CLASS:     Class II INDICATIONS:  abdominal pain and history of esophageal reflux. MEDICATIONS: Monitored anesthesia care and Propofol 110 mg IV TOPICAL ANESTHETIC: none  DESCRIPTION OF PROCEDURE: After the risks benefits and alternatives of the procedure were thoroughly explained, informed consent was obtained.  The LB [id number] endoscope was introduced through the mouth and advanced to the second portion of the duodenum , Without limitations.  The instrument was slowly withdrawn as the mucosa was fully examined.   EXAM: The esophagus and gastroesophageal junction were completely normal in appearance.  The stomach was entered and closely examined.The antrum, angularis, and lesser curvature were well visualized, including a retroflexed view of the cardia and fundus. The stomach wall was normally distensable.  The scope passed easily through the pylorus into the duodenum.  Retroflexed views revealed no abnormalities.     The scope was then withdrawn from the patient and the procedure completed.  COMPLICATIONS: There were no immediate complications.  ENDOSCOPIC IMPRESSION: 1. Normal EGD 2. GERD  RECOMMENDATIONS: 1.  Anti-reflux regimen to be followed 2.  Continue Prilosec (omeprazole) daily   REPEAT EXAM:  eSigned:  Eustace Quail, MD 09/29/2015 2:08 PM    CC:The Patient  ; I. Shamleffer,MD ([place])

## 2015-09-29 NOTE — Progress Notes (Signed)
Called to room to assist during endoscopic procedure.  Patient ID and intended procedure confirmed with present staff. Received instructions for my participation in the procedure from the performing physician.  

## 2015-09-29 NOTE — Patient Instructions (Signed)
Discharge instructions given. Handouts on polyps and GERD. Resume previous medications. YOU HAD AN ENDOSCOPIC PROCEDURE TODAY AT [facility]:   Refer to the procedure report that was given to you for any specific questions about what was found during the examination.  If the procedure report does not answer your questions, please call your gastroenterologist to clarify.  If you requested that your care partner not be given the details of your procedure findings, then the procedure report has been included in a sealed envelope for you to review at your convenience later.  YOU SHOULD EXPECT: Some feelings of bloating in the abdomen. Passage of more gas than usual.  Walking can help get rid of the air that was put into your GI tract during the procedure and reduce the bloating. If you had a lower endoscopy (such as a colonoscopy or flexible sigmoidoscopy) you may notice spotting of blood in your stool or on the toilet paper. If you underwent a bowel prep for your procedure, you may not have a normal bowel movement for a few days.  Please Note:  You might notice some irritation and congestion in your nose or some drainage.  This is from the oxygen used during your procedure.  There is no need for concern and it should clear up in a day or so.  SYMPTOMS TO REPORT IMMEDIATELY:   Following lower endoscopy (colonoscopy or flexible sigmoidoscopy):  Excessive amounts of blood in the stool  Significant tenderness or worsening of abdominal pains  Swelling of the abdomen that is new, acute  Fever of 100F or higher   Following upper endoscopy (EGD)  Vomiting of blood or coffee ground material  New chest pain or pain under the shoulder blades  Painful or persistently difficult swallowing  New shortness of breath  Fever of 100F or higher  Black, tarry-looking stools  For urgent or emergent issues, a gastroenterologist can be reached at any hour by calling [phone].   DIET: Your  first meal following the procedure should be a small meal and then it is ok to progress to your normal diet. Heavy or fried foods are harder to digest and may make you feel nauseous or bloated.  Likewise, meals heavy in dairy and vegetables can increase bloating.  Drink plenty of fluids but you should avoid alcoholic beverages for 24 hours.  ACTIVITY:  You should plan to take it easy for the rest of today and you should NOT DRIVE or use heavy machinery until tomorrow (because of the sedation medicines used during the test).    FOLLOW UP: Our staff will call the number listed on your records the next business day following your procedure to check on you and address any questions or concerns that you may have regarding the information given to you following your procedure. If we do not reach you, we will leave a message.  However, if you are feeling well and you are not experiencing any problems, there is no need to return our call.  We will assume that you have returned to your regular daily activities without incident.  If any biopsies were taken you will be contacted by phone or by letter within the next 1-3 weeks.  Please call us at [phone] if you have not heard about the biopsies in 3 weeks.    SIGNATURES/CONFIDENTIALITY: You and/or your care partner have signed paperwork which will be entered into your electronic medical record.  These signatures attest to the fact that that  the information above on your After Visit Summary has been reviewed and is understood.  Full responsibility of the confidentiality of this discharge information lies with you and/or your care-partner.

## 2015-09-30 ENCOUNTER — Telehealth: Payer: Self-pay | Admitting: *Deleted

## 2015-09-30 DIAGNOSIS — H9209 Otalgia, unspecified ear: Secondary | ICD-10-CM | POA: Diagnosis not present

## 2015-09-30 NOTE — Telephone Encounter (Signed)
  Follow up Call-  Call back number 09/29/2015  Post procedure Call Back phone  # [phone]  Permission to leave phone message Yes     Patient questions:  Do you have a fever, pain , or abdominal swelling? No. Pain Score  0 *  Have you tolerated food without any problems? Yes.    Have you been able to return to your normal activities? Yes.    Do you have any questions about your discharge instructions: Diet   No. Medications  No. Follow up visit  No.  Do you have questions or concerns about your Care? No.  Actions: * If pain score is 4 or above: No action needed, pain <4.

## 2015-10-02 ENCOUNTER — Encounter: Payer: Self-pay | Admitting: Internal Medicine

## 2015-10-19 DIAGNOSIS — E119 Type 2 diabetes mellitus without complications: Secondary | ICD-10-CM | POA: Diagnosis not present

## 2015-11-26 DIAGNOSIS — H6521 Chronic serous otitis media, right ear: Secondary | ICD-10-CM | POA: Diagnosis not present

## 2015-12-18 DIAGNOSIS — I10 Essential (primary) hypertension: Secondary | ICD-10-CM | POA: Diagnosis not present

## 2015-12-18 DIAGNOSIS — E1140 Type 2 diabetes mellitus with diabetic neuropathy, unspecified: Secondary | ICD-10-CM | POA: Diagnosis not present

## 2015-12-18 DIAGNOSIS — K219 Gastro-esophageal reflux disease without esophagitis: Secondary | ICD-10-CM | POA: Diagnosis not present

## 2015-12-21 ENCOUNTER — Ambulatory Visit
Admission: RE | Admit: 2015-12-21 | Discharge: 2015-12-21 | Disposition: A | Discharge status: Home / Self Care | Payer: Medicare HMO | Source: Ambulatory Visit | Attending: Nurse Practitioner | Admitting: Nurse Practitioner

## 2015-12-21 ENCOUNTER — Other Ambulatory Visit: Payer: Self-pay | Admitting: Nurse Practitioner

## 2015-12-21 DIAGNOSIS — R109 Unspecified abdominal pain: Secondary | ICD-10-CM

## 2015-12-21 DIAGNOSIS — W19XXXA Unspecified fall, initial encounter: Secondary | ICD-10-CM | POA: Diagnosis not present

## 2015-12-21 DIAGNOSIS — R0789 Other chest pain: Secondary | ICD-10-CM | POA: Diagnosis not present

## 2015-12-21 DIAGNOSIS — R0781 Pleurodynia: Secondary | ICD-10-CM | POA: Diagnosis not present

## 2016-01-12 DIAGNOSIS — H6521 Chronic serous otitis media, right ear: Secondary | ICD-10-CM | POA: Diagnosis not present

## 2016-02-15 DIAGNOSIS — R69 Illness, unspecified: Secondary | ICD-10-CM | POA: Diagnosis not present

## 2016-02-16 DIAGNOSIS — E1122 Type 2 diabetes mellitus with diabetic chronic kidney disease: Secondary | ICD-10-CM | POA: Diagnosis not present

## 2016-02-16 DIAGNOSIS — Z1389 Encounter for screening for other disorder: Secondary | ICD-10-CM | POA: Diagnosis not present

## 2016-02-16 DIAGNOSIS — E1140 Type 2 diabetes mellitus with diabetic neuropathy, unspecified: Secondary | ICD-10-CM | POA: Diagnosis not present

## 2016-02-16 DIAGNOSIS — Z0000 Encounter for general adult medical examination without abnormal findings: Secondary | ICD-10-CM | POA: Diagnosis not present

## 2016-02-16 DIAGNOSIS — N183 Chronic kidney disease, stage 3 (moderate): Secondary | ICD-10-CM | POA: Diagnosis not present

## 2016-02-16 DIAGNOSIS — Z7984 Long term (current) use of oral hypoglycemic drugs: Secondary | ICD-10-CM | POA: Diagnosis not present

## 2016-02-16 DIAGNOSIS — I10 Essential (primary) hypertension: Secondary | ICD-10-CM | POA: Diagnosis not present

## 2016-02-16 DIAGNOSIS — Z780 Asymptomatic menopausal state: Secondary | ICD-10-CM | POA: Diagnosis not present

## 2016-02-16 DIAGNOSIS — E785 Hyperlipidemia, unspecified: Secondary | ICD-10-CM | POA: Diagnosis not present

## 2016-03-02 DIAGNOSIS — H906 Mixed conductive and sensorineural hearing loss, bilateral: Secondary | ICD-10-CM | POA: Diagnosis not present

## 2016-03-02 DIAGNOSIS — H903 Sensorineural hearing loss, bilateral: Secondary | ICD-10-CM | POA: Diagnosis not present

## 2016-03-04 DIAGNOSIS — M7752 Other enthesopathy of left foot: Secondary | ICD-10-CM | POA: Diagnosis not present

## 2016-03-07 ENCOUNTER — Encounter: Payer: Self-pay | Admitting: Podiatry

## 2016-03-07 ENCOUNTER — Other Ambulatory Visit: Payer: Self-pay | Admitting: *Deleted

## 2016-03-07 ENCOUNTER — Ambulatory Visit (INDEPENDENT_AMBULATORY_CARE_PROVIDER_SITE_OTHER): Payer: Medicare HMO | Admitting: Podiatry

## 2016-03-07 ENCOUNTER — Ambulatory Visit (INDEPENDENT_AMBULATORY_CARE_PROVIDER_SITE_OTHER): Payer: Medicare HMO

## 2016-03-07 VITALS — BP 138/81 | HR 79 | Resp 16 | Ht 62.5 in | Wt 160.0 lb

## 2016-03-07 DIAGNOSIS — M779 Enthesopathy, unspecified: Secondary | ICD-10-CM | POA: Diagnosis not present

## 2016-03-07 DIAGNOSIS — M79672 Pain in left foot: Secondary | ICD-10-CM

## 2016-03-07 DIAGNOSIS — M1000 Idiopathic gout, unspecified site: Secondary | ICD-10-CM | POA: Diagnosis not present

## 2016-03-07 DIAGNOSIS — M79609 Pain in unspecified limb: Secondary | ICD-10-CM | POA: Diagnosis not present

## 2016-03-07 LAB — CBC WITH DIFFERENTIAL/PLATELET
BASOS ABS: 0 {cells}/uL (ref 0–200)
BASOS PCT: 0 %
EOS PCT: 1 %
Eosinophils Absolute: 116 cells/uL (ref 15–500)
HCT: 41.9 % (ref 35.0–45.0)
Hemoglobin: 14.2 g/dL (ref 11.7–15.5)
LYMPHS PCT: 24 %
Lymphs Abs: 2784 cells/uL (ref 850–3900)
MCH: 29.2 pg (ref 27.0–33.0)
MCHC: 33.9 g/dL (ref 32.0–36.0)
MCV: 86 fL (ref 80.0–100.0)
MONOS PCT: 6 %
MPV: 9.9 fL (ref 7.5–12.5)
Monocytes Absolute: 696 cells/uL (ref 200–950)
NEUTROS ABS: 8004 {cells}/uL — AB (ref 1500–7800)
Neutrophils Relative %: 69 %
PLATELETS: 286 10*3/uL (ref 140–400)
RBC: 4.87 MIL/uL (ref 3.80–5.10)
RDW: 13.9 % (ref 11.0–15.0)
WBC: 11.6 10*3/uL — ABNORMAL HIGH (ref 3.8–10.8)

## 2016-03-07 LAB — URIC ACID: URIC ACID, SERUM: 8.2 mg/dL — AB (ref 2.4–7.0)

## 2016-03-07 LAB — RHEUMATOID FACTOR: Rhuematoid fact SerPl-aCnc: <10 IU/mL (ref <=14)

## 2016-03-07 LAB — C-REACTIVE PROTEIN: CRP: 3.7 mg/dL — AB (ref <0.60)

## 2016-03-07 MED ORDER — TRIAMCINOLONE ACETONIDE 10 MG/ML IJ SUSP
10.0000 mg | Freq: Once | INTRAMUSCULAR | Status: AC
  Administered 2016-03-07: 10 mg

## 2016-03-07 MED ORDER — PREDNISONE 10 MG PO TABS: ORAL_TABLET | ORAL | Status: DC

## 2016-03-07 NOTE — Progress Notes (Signed)
   Subjective:    Patient ID: Rebecca Harmon, female    DOB: 1946-07-11, 70 y.o.   MRN: [id number]  HPI Patient presents with foot pain in their left foot; swollen; whole foot hurts; pt stated, "More pain on top of foot; pain started last Thursday, March 03, 2016"  Pt diabetic type 2; sugar=did not take today; A1C=6.4   Review of Systems  HENT: Positive for hearing loss and sinus pressure.   Gastrointestinal: Positive for abdominal pain.  Allergic/Immunologic: Positive for food allergies.  All other systems reviewed and are negative.      Objective:   Physical Exam        Assessment & Plan:

## 2016-03-08 LAB — ANA, IFA COMPREHENSIVE PANEL
ANA: NEGATIVE
ENA SM Ab Ser-aCnc: <1
SM/RNP: <1
SSA (RO) (ENA) ANTIBODY, IGG: NEGATIVE
SSB (La) (ENA) Antibody, IgG: <1
Scleroderma (Scl-70) (ENA) Antibody, IgG: <1
ds DNA Ab: 1 IU/mL

## 2016-03-08 LAB — SEDIMENTATION RATE: SED RATE: 22 mm/h (ref 0–30)

## 2016-03-08 NOTE — Progress Notes (Signed)
Subjective:     Patient ID: Rebecca Harmon, female   DOB: [birth year], 70 y.o.   MRN: [id number]  HPI patient states her whole left foot started hurting a week ago but she does not remember specific injury but it's swollen and painful on both the inside and outside with the inside being worse   Review of Systems  All other systems reviewed and are negative.      Objective:   Physical Exam  Constitutional: She is oriented to person, place, and time.  Cardiovascular: Intact distal pulses.   Musculoskeletal: Normal range of motion.  Neurological: She is oriented to person, place, and time.  Skin: Skin is warm.  Nursing note and vitals reviewed.  neurovascular status intact muscle strength adequate range of motion within normal limits with patient found to have diminishment of range of motion of the subtalar joint left. Patient has forefoot edema and exquisite discomfort on the inside of the left ankle with moderate discomfort in the outside the left ankle with redness noted. No open areas noted or proximal edema erythema drainage and negative Homans sign was noted     Assessment:     Appears to be some form of inflammatory condition with possibility for gout with inflammatory edema present    Plan:     H&P x-rays reviewed with patient. Today I went ahead did careful injection of the medial side of the ankle 3 mg Kenalog 5 mill grams Xylocaine and applied Unna boot Ace wrap and surgical shoe with instructions on elevation and keeping this on for 3-4 days. Sent for arthritic profile and reappoint to recheck again in the next one week or earlier if needed  X-rays of the left foot and ankle indicated there is no signs stress fracture arthritis with indications of swelling

## 2016-03-14 ENCOUNTER — Encounter: Payer: Self-pay | Admitting: Podiatry

## 2016-03-14 ENCOUNTER — Ambulatory Visit (INDEPENDENT_AMBULATORY_CARE_PROVIDER_SITE_OTHER): Payer: Medicare HMO | Admitting: Podiatry

## 2016-03-14 DIAGNOSIS — G629 Polyneuropathy, unspecified: Secondary | ICD-10-CM

## 2016-03-14 DIAGNOSIS — M1000 Idiopathic gout, unspecified site: Secondary | ICD-10-CM

## 2016-03-14 DIAGNOSIS — M779 Enthesopathy, unspecified: Secondary | ICD-10-CM

## 2016-03-14 NOTE — Patient Instructions (Signed)

## 2016-03-15 NOTE — Progress Notes (Signed)
Subjective:     Patient ID: Rebecca Harmon, female   DOB: [birth year], 70 y.o.   MRN: [id number]  HPI patient states my ankle feels quite a bit better but I'm still having some light discomfort and I wanted to review blood work   Review of Systems     Objective:   Physical Exam Neurovascular status intact significant reduction of edema in the medial ankle area left with patient having discomfort when pressed deeply but improved    Assessment:     Improve tendinitis-like symptomatology    Plan:     Reviewed condition and advised on continued supportive shoe gear usage and discussed the uric acid was mildly elevated and we will watch it and if it were to happen again were given need to consider treatment for gout. Gave her copy of blood work

## 2016-05-03 DIAGNOSIS — R69 Illness, unspecified: Secondary | ICD-10-CM | POA: Diagnosis not present

## 2016-06-20 ENCOUNTER — Other Ambulatory Visit: Payer: Self-pay | Admitting: Internal Medicine

## 2016-06-20 DIAGNOSIS — I10 Essential (primary) hypertension: Secondary | ICD-10-CM | POA: Diagnosis not present

## 2016-06-20 DIAGNOSIS — R1011 Right upper quadrant pain: Secondary | ICD-10-CM

## 2016-06-20 DIAGNOSIS — E1140 Type 2 diabetes mellitus with diabetic neuropathy, unspecified: Secondary | ICD-10-CM | POA: Diagnosis not present

## 2016-06-20 DIAGNOSIS — R110 Nausea: Secondary | ICD-10-CM | POA: Diagnosis not present

## 2016-06-20 DIAGNOSIS — Z7984 Long term (current) use of oral hypoglycemic drugs: Secondary | ICD-10-CM | POA: Diagnosis not present

## 2016-06-20 DIAGNOSIS — N183 Chronic kidney disease, stage 3 (moderate): Secondary | ICD-10-CM | POA: Diagnosis not present

## 2016-06-20 DIAGNOSIS — R634 Abnormal weight loss: Secondary | ICD-10-CM | POA: Diagnosis not present

## 2016-06-22 ENCOUNTER — Ambulatory Visit
Admission: RE | Admit: 2016-06-22 | Discharge: 2016-06-22 | Disposition: A | Discharge status: Home / Self Care | Payer: Medicare HMO | Source: Ambulatory Visit | Attending: Internal Medicine | Admitting: Internal Medicine

## 2016-06-22 DIAGNOSIS — R1011 Right upper quadrant pain: Secondary | ICD-10-CM | POA: Diagnosis not present

## 2016-06-22 DIAGNOSIS — K760 Fatty (change of) liver, not elsewhere classified: Secondary | ICD-10-CM | POA: Diagnosis not present

## 2016-06-27 ENCOUNTER — Other Ambulatory Visit: Payer: Medicare HMO

## 2016-07-04 DIAGNOSIS — K589 Irritable bowel syndrome without diarrhea: Secondary | ICD-10-CM | POA: Diagnosis not present

## 2016-07-04 DIAGNOSIS — Z7984 Long term (current) use of oral hypoglycemic drugs: Secondary | ICD-10-CM | POA: Diagnosis not present

## 2016-07-04 DIAGNOSIS — R1011 Right upper quadrant pain: Secondary | ICD-10-CM | POA: Diagnosis not present

## 2016-07-04 DIAGNOSIS — N183 Chronic kidney disease, stage 3 (moderate): Secondary | ICD-10-CM | POA: Diagnosis not present

## 2016-07-04 DIAGNOSIS — K921 Melena: Secondary | ICD-10-CM | POA: Diagnosis not present

## 2016-07-04 DIAGNOSIS — E1140 Type 2 diabetes mellitus with diabetic neuropathy, unspecified: Secondary | ICD-10-CM | POA: Diagnosis not present

## 2016-07-25 IMAGING — CR DG ABDOMEN 1V
1 series · 1 of 1 positions shown · non-contrast
Comparison: CT 04/11/2014.

CLINICAL DATA: Fall 2 days ago.  Right flank pain.

EXAM:
ABDOMEN - 1 VIEW

[t abdomen supine]
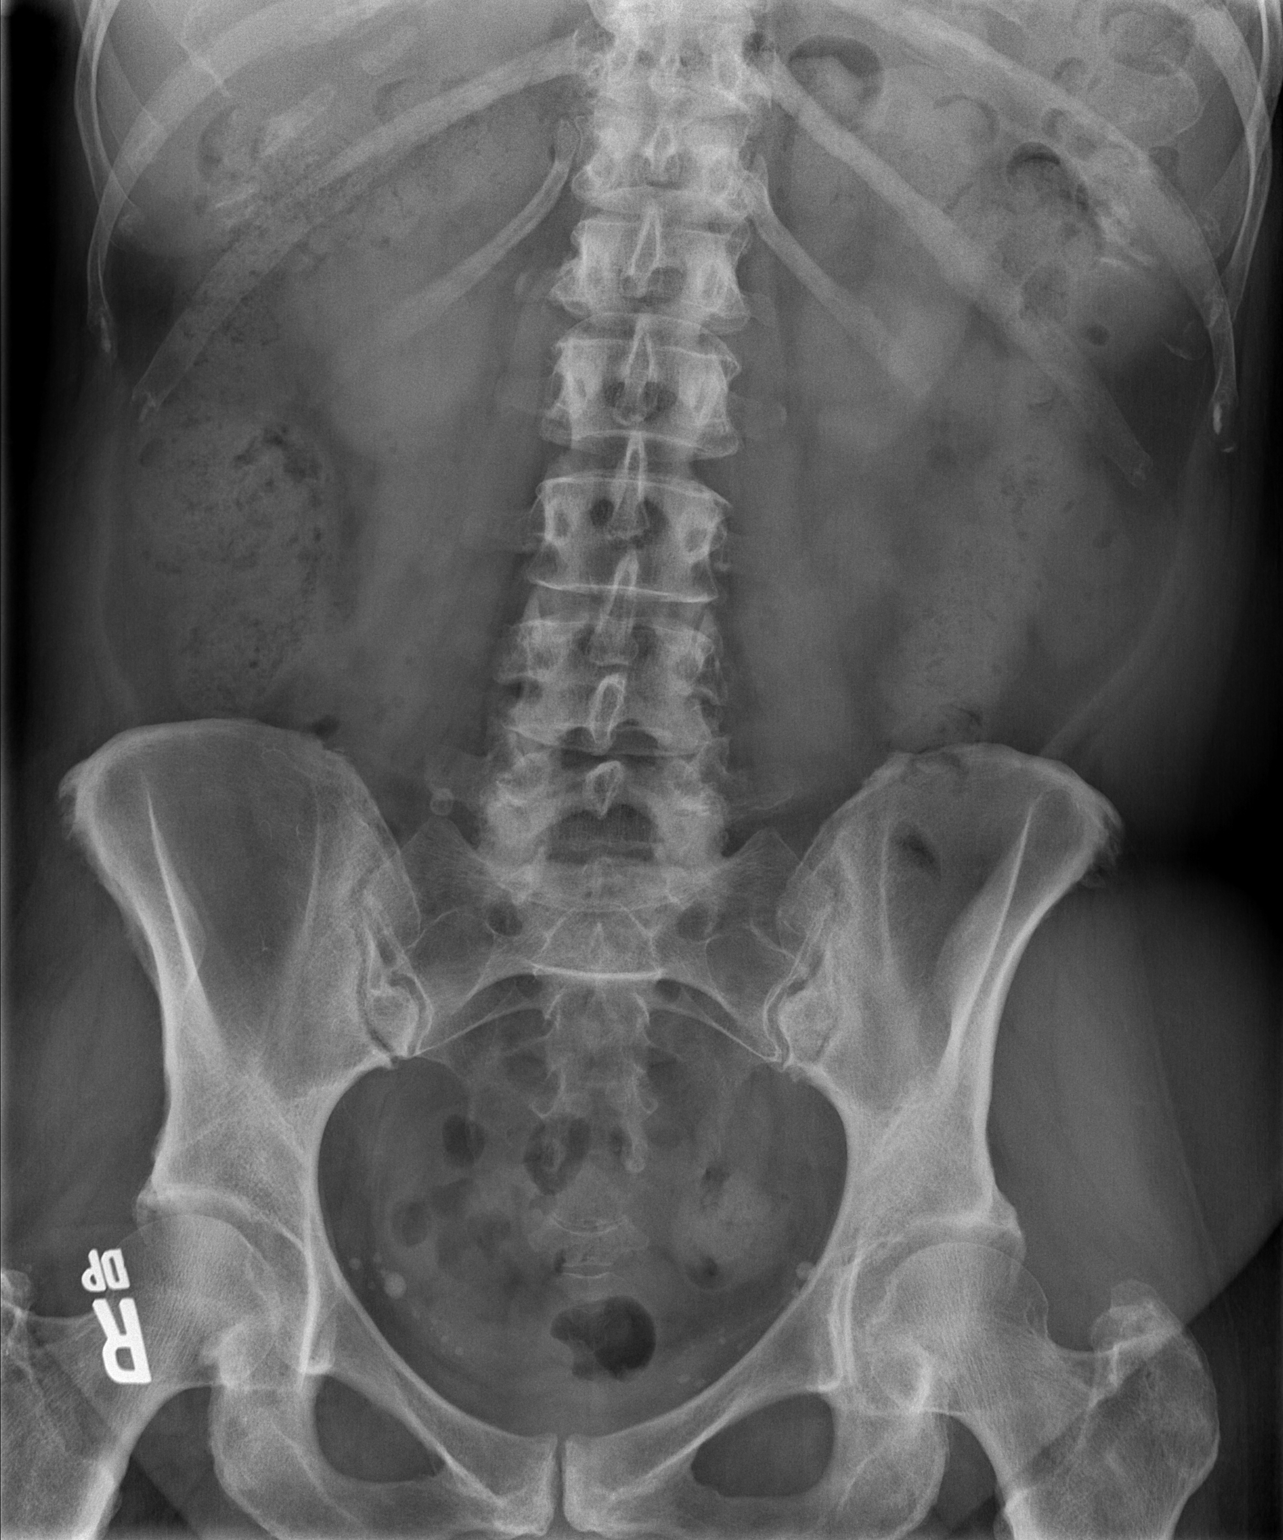

[1 of 1 positions shown; findings below may reference images not displayed]

FINDINGS: Soft tissue structures are unremarkable. No bowel distention. Stool
noted throughout the colon. Rounded calcific density is most likely
within the abdomen representing a phlebolith or tiny calcified lymph
node . Similar finding noted on prior CT . Pelvic calcifications
noted consistent phleboliths. No acute bony abnormality.
IMPRESSION: Stool noted throughout the colon. No bowel distention. No evidence
of nephrolithiasis or definite urolithiasis. Lower abdominal and
pelvic calcifications are most likely phleboliths. If symptoms
persist follow-up KUB suggested.

## 2016-07-27 ENCOUNTER — Ambulatory Visit (INDEPENDENT_AMBULATORY_CARE_PROVIDER_SITE_OTHER): Payer: Medicare HMO | Admitting: Internal Medicine

## 2016-07-27 ENCOUNTER — Encounter: Payer: Self-pay | Admitting: Internal Medicine

## 2016-07-27 VITALS — BP 158/72 | HR 80 | Ht 62.0 in | Wt 163.0 lb

## 2016-07-27 DIAGNOSIS — K625 Hemorrhage of anus and rectum: Secondary | ICD-10-CM | POA: Diagnosis not present

## 2016-07-27 DIAGNOSIS — K219 Gastro-esophageal reflux disease without esophagitis: Secondary | ICD-10-CM

## 2016-07-27 DIAGNOSIS — R109 Unspecified abdominal pain: Secondary | ICD-10-CM | POA: Diagnosis not present

## 2016-07-27 NOTE — Progress Notes (Signed)
HISTORY OF PRESENT ILLNESS:  Rebecca Harmon is a 70 y.o. female with past medical history as listed below who is sent today by her primary care provider regarding complaints of abdominal pain and rectal bleeding. Patient was evaluated in August 2016 for alternating bowel habits, epigastric pain, and chronic lower abdominal pain. She was also evaluated by myself for similar symptoms in 2010. She has had previous colonoscopy in 2008 which was normal. Most recent colonoscopy November 2016 revealed a diminutive right colon adenoma but was otherwise normal. Follow-up in 5 years recommended. Upper endoscopy at that same time was normal. She was advised with regards to reflux disease. She did undergo CT scan 2015 for abdominal pain. This was negative except for fatty liver. More recently has been seeing her PCP for complaints of rectal bleeding and chronic abdominal pain. She states that the pain is the same type but may be worse in terms of intensity. She is worried about sinister diagnoses. Blood work by PCP in July revealed unremarkable comprehensive metabolic panel including liver tests. Unremarkable urinalysis. Unremarkable CBC with hemoglobin 15.3. Repeat laboratories the following month included CBC which was unchanged as well as comprehensive metabolic panel. Right upper quadrant ultrasound 06/22/2016 revealed fatty liver but was otherwise negative. Patient tells me that her abdominal discomfort is exacerbated by direct palpation. Difficult to lie on her belly at night. Worse with bending. She has had several abdominal and pelvic surgeries. She continues with alternating bowel habits. Terms of bleeding, she did report red blood clot on 2 occasions with defecation. The stool itself appeared normal. Her weight has been stable over the past year. No nausea, vomiting, or fever. She does have bloating  REVIEW OF SYSTEMS:  All non-GI ROS negative except for sinus and allergy trouble, fatigue, muscle cramps,  night sweats, headaches, hearing problems, urinary leakage  Past Medical History:  Diagnosis Date  . Allergy    SEASONAL  . Chest pain 2-13 2012   echo EF 50-55% read by Dr. Claiborne Billings pt. of Dr. Ellyn Hack  . Colon polyp    adenomatous  . Diabetes mellitus without complication ([place])   . Dizziness 03-06-2013   stress test Read by Dr. Gwenlyn Found, normal stress test  . Fatty liver   . GERD (gastroesophageal reflux disease)   . Hyperlipidemia   . Hypertension     Past Surgical History:  Procedure Laterality Date  . ABDOMINAL HYSTERECTOMY    . APPENDECTOMY    . CHOLECYSTECTOMY    . COLONOSCOPY    . THYROIDECTOMY, PARTIAL    . TONSILLECTOMY     x2    Social History Rebecca Harmon  reports that she has quit smoking. She has never used smokeless tobacco. She reports that she does not drink alcohol or use drugs.  family history includes Diabetes in her mother and sister; Heart disease in her father.  Allergies  Allergen Reactions  . Codeine Other (See Comments)    dizzy  . Tetanus Toxoid   . Vytorin [Ezetimibe-Simvastatin] Other (See Comments)    UNSPECIFIED       PHYSICAL EXAMINATION: Vital signs: BP (!) 158/72   Pulse 80   Ht 5\' 2"  (1.575 m)   Wt 163 lb (73.9 kg)   BMI 29.81 kg/m   Constitutional:Pleasant, obese, generally well-appearing, no acute distress Psychiatric: alert and oriented x3, cooperative Eyes: extraocular movements intact, anicteric, conjunctiva pink Mouth: oral pharynx moist, no lesions Neck: supple no lymphadenopathy Cardiovascular: heart regular rate and rhythm, no murmur Lungs: clear  to auscultation bilaterally Abdomen: soft, tenderness over the abdominal wall with palpation, nondistended, no obvious ascites, no peritoneal signs, normal bowel sounds, no organomegaly Rectal: No external abnormalities. Minimal hemorrhoids noted. No pain, tenderness, or mass. Hemoccult negative stool Extremities: no clubbing cyanosis or lower extremity edema  bilaterally Skin: no lesions on visible extremities Neuro: No focal deficits. Cranial nerves intact  ASSESSMENT:  #1. Chronic abdominal pain as described. Most likely musculoskeletal or secondary to adhesions based on history of negative extensive workups. Not a primary GI disorder. I explained this. #2. Alternating bowel habits. Ongoing. Is on metformin #3. Problems with minor rectal bleeding which has resolved secondary to hemorrhoids #4. History of GERD. No symptoms on omeprazole. Normal upper endoscopy November 2016 #5. History of diminutive tubular adenoma November 2016   PLAN:  #1. Discussed abdominal wall pain and pain with adhesions. Return to PCP for symptomatic treatment if needed #2. Metamucil 2 tablespoons daily for alternating bowel habits #3. Reflux precautions for GERD #4. Continue PPI for GERD. Lowest dose to control symptoms recommended #5. Fiber supplementation should help hemorrhoids. If hemorrhoids become a recurrent problem could consider suppository therapy. #6. Return to PCP. GI follow-up as needed #7. Routine surveillance colonoscopy around November 2021  40 minutes was spent face-to-face with the patient. Greater than 50% a time she is for counseling regarding her chronic abdominal pain, alternating bowel habits, and GERD

## 2016-07-27 NOTE — Patient Instructions (Addendum)
Please purchase the following medications over the counter and take as directed: Metamucil 1 tablespoon daily x 2 weeks, then as directed   Please follow up with primary care physician regarding muscle tenderness.  Follow up with Dr Henrene Pastor as needed.  If you are age 72 or older, your body mass index should be between 23-30. Your Body mass index is 29.81 kg/m. If this is out of the aforementioned range listed, please consider follow up with your Primary Care Provider.  If you are age 76 or younger, your body mass index should be between 19-25. Your Body mass index is 29.81 kg/m. If this is out of the aformentioned range listed, please consider follow up with your Primary Care Provider.

## 2016-08-04 DIAGNOSIS — R69 Illness, unspecified: Secondary | ICD-10-CM | POA: Diagnosis not present

## 2016-09-26 ENCOUNTER — Other Ambulatory Visit: Payer: Self-pay | Admitting: Internal Medicine

## 2016-09-26 DIAGNOSIS — I10 Essential (primary) hypertension: Secondary | ICD-10-CM | POA: Diagnosis not present

## 2016-09-26 DIAGNOSIS — Z7984 Long term (current) use of oral hypoglycemic drugs: Secondary | ICD-10-CM | POA: Diagnosis not present

## 2016-09-26 DIAGNOSIS — E785 Hyperlipidemia, unspecified: Secondary | ICD-10-CM | POA: Diagnosis not present

## 2016-09-26 DIAGNOSIS — N183 Chronic kidney disease, stage 3 (moderate): Secondary | ICD-10-CM | POA: Diagnosis not present

## 2016-09-26 DIAGNOSIS — R109 Unspecified abdominal pain: Secondary | ICD-10-CM

## 2016-09-26 DIAGNOSIS — E1142 Type 2 diabetes mellitus with diabetic polyneuropathy: Secondary | ICD-10-CM | POA: Diagnosis not present

## 2016-09-28 ENCOUNTER — Ambulatory Visit
Admission: RE | Admit: 2016-09-28 | Discharge: 2016-09-28 | Disposition: A | Discharge status: Home / Self Care | Payer: Medicare HMO | Source: Ambulatory Visit | Attending: Internal Medicine | Admitting: Internal Medicine

## 2016-09-28 DIAGNOSIS — R109 Unspecified abdominal pain: Secondary | ICD-10-CM

## 2016-09-28 DIAGNOSIS — N200 Calculus of kidney: Secondary | ICD-10-CM | POA: Diagnosis not present

## 2016-09-28 MED ORDER — IOPAMIDOL (ISOVUE-300) INJECTION 61%
100.0000 mL | Freq: Once | INTRAVENOUS | Status: AC | PRN
  Administered 2016-09-28: 100 mL via INTRAVENOUS

## 2016-10-19 DIAGNOSIS — R05 Cough: Secondary | ICD-10-CM | POA: Diagnosis not present

## 2016-10-19 DIAGNOSIS — J069 Acute upper respiratory infection, unspecified: Secondary | ICD-10-CM | POA: Diagnosis not present

## 2016-10-19 DIAGNOSIS — J028 Acute pharyngitis due to other specified organisms: Secondary | ICD-10-CM | POA: Diagnosis not present

## 2016-10-25 DIAGNOSIS — E119 Type 2 diabetes mellitus without complications: Secondary | ICD-10-CM | POA: Diagnosis not present

## 2016-10-25 DIAGNOSIS — Z0100 Encounter for examination of eyes and vision without abnormal findings: Secondary | ICD-10-CM | POA: Diagnosis not present

## 2016-10-25 DIAGNOSIS — H524 Presbyopia: Secondary | ICD-10-CM | POA: Diagnosis not present

## 2016-10-26 DIAGNOSIS — R69 Illness, unspecified: Secondary | ICD-10-CM | POA: Diagnosis not present

## 2016-11-24 DIAGNOSIS — B9789 Other viral agents as the cause of diseases classified elsewhere: Secondary | ICD-10-CM | POA: Diagnosis not present

## 2016-11-24 DIAGNOSIS — J069 Acute upper respiratory infection, unspecified: Secondary | ICD-10-CM | POA: Diagnosis not present

## 2017-01-30 DIAGNOSIS — R69 Illness, unspecified: Secondary | ICD-10-CM | POA: Diagnosis not present

## 2017-02-01 DIAGNOSIS — R69 Illness, unspecified: Secondary | ICD-10-CM | POA: Diagnosis not present

## 2017-02-02 DIAGNOSIS — H6501 Acute serous otitis media, right ear: Secondary | ICD-10-CM | POA: Diagnosis not present

## 2017-02-02 DIAGNOSIS — H906 Mixed conductive and sensorineural hearing loss, bilateral: Secondary | ICD-10-CM | POA: Diagnosis not present

## 2017-02-02 DIAGNOSIS — J310 Chronic rhinitis: Secondary | ICD-10-CM | POA: Diagnosis not present

## 2017-02-14 DIAGNOSIS — L821 Other seborrheic keratosis: Secondary | ICD-10-CM | POA: Diagnosis not present

## 2017-02-14 DIAGNOSIS — L7211 Pilar cyst: Secondary | ICD-10-CM | POA: Diagnosis not present

## 2017-02-16 DIAGNOSIS — E1142 Type 2 diabetes mellitus with diabetic polyneuropathy: Secondary | ICD-10-CM | POA: Diagnosis not present

## 2017-02-16 DIAGNOSIS — E785 Hyperlipidemia, unspecified: Secondary | ICD-10-CM | POA: Diagnosis not present

## 2017-02-16 DIAGNOSIS — Z1231 Encounter for screening mammogram for malignant neoplasm of breast: Secondary | ICD-10-CM | POA: Diagnosis not present

## 2017-02-16 DIAGNOSIS — Z0000 Encounter for general adult medical examination without abnormal findings: Secondary | ICD-10-CM | POA: Diagnosis not present

## 2017-02-16 DIAGNOSIS — Z1389 Encounter for screening for other disorder: Secondary | ICD-10-CM | POA: Diagnosis not present

## 2017-02-16 DIAGNOSIS — N183 Chronic kidney disease, stage 3 (moderate): Secondary | ICD-10-CM | POA: Diagnosis not present

## 2017-02-16 DIAGNOSIS — M189 Osteoarthritis of first carpometacarpal joint, unspecified: Secondary | ICD-10-CM | POA: Diagnosis not present

## 2017-02-16 DIAGNOSIS — Z1211 Encounter for screening for malignant neoplasm of colon: Secondary | ICD-10-CM | POA: Diagnosis not present

## 2017-02-16 DIAGNOSIS — Z780 Asymptomatic menopausal state: Secondary | ICD-10-CM | POA: Diagnosis not present

## 2017-02-16 DIAGNOSIS — I10 Essential (primary) hypertension: Secondary | ICD-10-CM | POA: Diagnosis not present

## 2017-03-08 DIAGNOSIS — H66001 Acute suppurative otitis media without spontaneous rupture of ear drum, right ear: Secondary | ICD-10-CM | POA: Diagnosis not present

## 2017-04-12 DIAGNOSIS — H6993 Unspecified Eustachian tube disorder, bilateral: Secondary | ICD-10-CM | POA: Diagnosis not present

## 2017-04-12 DIAGNOSIS — J300 Vasomotor rhinitis: Secondary | ICD-10-CM | POA: Diagnosis not present

## 2017-07-04 ENCOUNTER — Encounter: Payer: Self-pay | Admitting: Allergy and Immunology

## 2017-07-04 ENCOUNTER — Ambulatory Visit (INDEPENDENT_AMBULATORY_CARE_PROVIDER_SITE_OTHER): Payer: Medicare HMO | Admitting: Allergy and Immunology

## 2017-07-04 VITALS — BP 126/70 | HR 85 | Temp 98.1°F | Resp 18 | Ht 62.0 in | Wt 162.6 lb

## 2017-07-04 DIAGNOSIS — J324 Chronic pansinusitis: Secondary | ICD-10-CM

## 2017-07-04 DIAGNOSIS — R430 Anosmia: Secondary | ICD-10-CM | POA: Diagnosis not present

## 2017-07-04 DIAGNOSIS — H6983 Other specified disorders of Eustachian tube, bilateral: Secondary | ICD-10-CM | POA: Diagnosis not present

## 2017-07-04 DIAGNOSIS — J3089 Other allergic rhinitis: Secondary | ICD-10-CM

## 2017-07-04 NOTE — Progress Notes (Signed)
Dear Dr. Kelton Pillar,  Thank you for referring Rebecca Harmon to the [facility] on 07/04/2017.   Below is a summation of this patient's evaluation and recommendations.  Thank you for your referral. I will keep you informed about this patient's response to treatment.   If you have any questions please do not hesitate to contact me.   Sincerely,  Jiles Prows, MD Allergy / Immunology [place]   ______________________________________________________________________    NEW PATIENT NOTE  Referring Provider: Ronnell Guadalajara* Primary Provider: Lottie Dawson, MD Date of office visit: 07/04/2017    Subjective:   Chief Complaint:  Rebecca Harmon (DOB: [birth year]) is a 71 y.o. female who presents to the clinic on 07/04/2017 with a chief complaint of Other .     HPI: Rebecca Harmon presents to this clinic in evaluation of ear and airway issues.  She has a distant history of allergic disease for which she underwent a course of immunotherapy approximately 45 years ago. Sometime over the course of the past 2 years, after sustaining a very severe upper respiratory tract infection, she has had problems with her ears. She develops sudden onset of muffled hearing with sudden resolution that can occur for hours to days. She feels as though her ears are "opening and closing". She has seen Dr. Lucia Gaskins over the course of the past 2 years and has had multiple forms of therapy which has not really helped her to any large degree. She has been on Flonase and she has been treated with systemic steroids and antibiotics. Her last antibiotic administration was last February. She has had placement of ear ventilation tube on the right and now she has chronic otorrhea.  She does have issues with nasal congestion on occasion and maybe some slight sneezing but not any other significant atopic  symptoms. Her ability is smell appears to be intact. She does have a low-grade headache that is there every day but does not require any therapy. She does not have any active reflux disease as long as she remains on omeprazole.  In addition, she has had an issue with eating fish and shellfish. In December 2017 she ate fried flounder and shrimp and developed vomiting and diarrhea that lasted several hours and this was repeated once again with a similar type of meal about a month later. She then ate tilapia and developed nausea. She then shrimp and grits and developed vomiting and diarrhea. She has no associated systemic or constitutional symptoms associated with these reactions.  Past Medical History:  Diagnosis Date  . Allergy    SEASONAL  . Chest pain 2-13 2012   echo EF 50-55% read by Dr. Claiborne Billings pt. of Dr. Ellyn Hack  . Colon polyp    adenomatous  . Diabetes mellitus without complication ([place])   . Dizziness 03-06-2013   stress test Read by Dr. Gwenlyn Found, normal stress test  . Fatty liver   . GERD (gastroesophageal reflux disease)   . Hyperlipidemia   . Hypertension     Past Surgical History:  Procedure Laterality Date  . ABDOMINAL HYSTERECTOMY    . APPENDECTOMY    . CHOLECYSTECTOMY    . COLONOSCOPY    . THYROIDECTOMY, PARTIAL    . TONSILLECTOMY     x2    Allergies as of 07/04/2017      Reactions   Codeine Other (See Comments)   dizzy   Tetanus  Toxoid    Vytorin [ezetimibe-simvastatin] Other (See Comments)   UNSPECIFIED      Medication List      aspirin EC 81 MG tablet Take 81 mg by mouth daily.   gabapentin 100 MG capsule Commonly known as:  NEURONTIN Take 100 mg by mouth as needed.   hydrochlorothiazide 25 MG tablet Commonly known as:  HYDRODIURIL   metFORMIN 1000 MG tablet Commonly known as:  GLUCOPHAGE Take 1,000 mg by mouth 2 (two) times daily.   metoprolol succinate 100 MG 24 hr tablet Commonly known as:  TOPROL-XL   omeprazole 40 MG capsule Commonly known as:   PRILOSEC   ONE TOUCH ULTRA TEST test strip Generic drug:  glucose blood       Review of systems negative except as noted in HPI / PMHx or noted below:  Review of Systems  Constitutional: Negative.   HENT: Negative.   Eyes: Negative.   Respiratory: Negative.   Cardiovascular: Negative.   Gastrointestinal: Negative.   Genitourinary: Negative.   Musculoskeletal: Negative.   Skin: Negative.   Neurological: Negative.   Endo/Heme/Allergies: Negative.   Psychiatric/Behavioral: Negative.     Family History  Problem Relation Age of Onset  . Diabetes Mother   . Diabetes Sister   . Heart disease Father   . Colon cancer Neg Hx   . Stomach cancer Neg Hx     Social History   Social History  . Marital status: Married    Spouse name: N/A  . Number of children: 2  . Years of education: N/A   Occupational History  . Retired    Social History Main Topics  . Smoking status: Former Research scientist (life sciences)  . Smokeless tobacco: Never Used  . Alcohol use No  . Drug use: No  . Sexual activity: Yes    Partners: Male    Birth control/ protection: None   Other Topics Concern  . Not on file   Social History Narrative  . No narrative on file    Environmental and Social history  Lives in a house with a dry environment, a dog located inside the household, carpeting in the bedroom, no plastic on the bed, no plastic on the pillow, no smokers located inside the household.  Objective:   Vitals:   07/04/17 1338  BP: 126/70  Pulse: 85  Resp: 18  Temp: 98.1 F (36.7 C)   Height: 5\' 2"  (157.5 cm) Weight: 162 lb 9.6 oz (73.8 kg)  Physical Exam  Constitutional: She is well-developed, well-nourished, and in no distress.  HENT:  Head: Normocephalic. Head is without right periorbital erythema and without left periorbital erythema.  Right Ear: Tympanic membrane and external ear normal. A foreign body (tube) is present.  Left Ear: External ear and ear canal normal. Tympanic membrane is scarred.    Nose: Nose normal. No mucosal edema or rhinorrhea.  Mouth/Throat: Oropharynx is clear and moist and mucous membranes are normal. No oropharyngeal exudate.  Eyes: Pupils are equal, round, and reactive to light. Conjunctivae and lids are normal.  Neck: Trachea normal. No tracheal deviation present. No thyromegaly present.  Cardiovascular: Normal rate, regular rhythm, S1 normal, S2 normal and normal heart sounds.   No murmur heard. Pulmonary/Chest: Effort normal. No stridor. No tachypnea. No respiratory distress. She has no wheezes. She has no rales. She exhibits no tenderness.  Abdominal: Soft. She exhibits no distension and no mass. There is no hepatosplenomegaly. There is no tenderness. There is no rebound and no guarding.  Musculoskeletal: She  exhibits no edema or tenderness.  Lymphadenopathy:       Head (right side): No tonsillar adenopathy present.       Head (left side): No tonsillar adenopathy present.    She has no cervical adenopathy.    She has no axillary adenopathy.  Neurological: She is alert. Gait normal.  Skin: No rash noted. She is not diaphoretic. No erythema. No pallor. Nails show no clubbing.  Psychiatric: Mood and affect normal.    Diagnostics: Allergy skin tests were performed. She did not demonstrate any hypersensitivity against a screening panel of foods or aeroallergens.  Assessment and Plan:    1. Other allergic rhinitis   2. ETD (Eustachian tube dysfunction), bilateral   3. Chronic pansinusitis   4. Anosmia     1. Allergen avoidance measures?  2. Treat and prevent inflammation:   A. OTC Nasacort - one spray each nostril one time per day  B. montelukast 10 mg - one tablet one time per day  3. If needed:   A. Cetirizine 10mg  one tablet one time per day  4. Obtain limited sinus CT scan  5. EpiPen?  6. Return to clinic in 4 weeks or earlier if problem  Carlus Pavlov has a inflamed airway and eustation tube and we will treat her with anti-inflammatory  medications for her airway as noted above and rule out the possibility of chronic sinusitis contributing to some of her symptoms by imaging her airway. I will see her back in this clinic in 4 weeks or earlier if there is a problem. She does have a history of intolerance to various foods with the development of vomiting and diarrhea mostly related to shellfish and occasionally fish but there is no evidence that she has a IgE mediated mechanism responsible for this issue and at this point we will just have her avoid foods that give rise to GI upset and not provide her an EpiPen.  Jiles Prows, MD Allergy / Immunology [facility]

## 2017-07-04 NOTE — Patient Instructions (Addendum)
  1. Allergen avoidance measures?  2. Treat and prevent inflammation:   A. OTC Nasacort - one spray each nostril one time per day  B. montelukast 10 mg - one tablet one time per day  3. If needed:   A. Cetirizine 10mg  one tablet one time per day  4. Obtain limited sinus CT scan  5. EpiPen?  6. Return to clinic in 4 weeks or earlier if problem

## 2017-07-05 ENCOUNTER — Other Ambulatory Visit: Payer: Self-pay | Admitting: Allergy and Immunology

## 2017-07-05 MED ORDER — MONTELUKAST SODIUM 10 MG PO TABS: 10.0000 mg | ORAL_TABLET | Freq: Every day | ORAL | 5 refills | Status: DC

## 2017-07-05 NOTE — Telephone Encounter (Signed)
Patient saw Dr. Neldon Mc yesterday, 07-04-17, and said he gave her paperwork about Montelukast and she wanted to know if it had been called in yet. I looked and don't see it on her list. She would like this called in, please. [facility] on [address].

## 2017-07-05 NOTE — Telephone Encounter (Signed)
Prescription has been sent to pharmacy.

## 2017-07-06 ENCOUNTER — Telehealth: Payer: Self-pay | Admitting: Allergy and Immunology

## 2017-07-06 NOTE — Telephone Encounter (Signed)
Patients CT scan was approved by insurance. Writer called [facility] to set it up and they informed me to have the patient call them and set up the best available date and time for her. I spoke with patient and gave her the information to call and schedule her CT scan. Patient said she would call and schedule it and thanked me for giving her a call.

## 2017-07-06 NOTE — Addendum Note (Signed)
Addended by: Angelica Ran on: 07/06/2017 02:32 PM   Modules accepted: Orders

## 2017-07-12 ENCOUNTER — Ambulatory Visit (HOSPITAL_COMMUNITY)
Admission: RE | Admit: 2017-07-12 | Discharge: 2017-07-12 | Disposition: A | Discharge status: Home / Self Care | Payer: Medicare HMO | Source: Ambulatory Visit | Attending: Allergy and Immunology | Admitting: Allergy and Immunology

## 2017-07-12 ENCOUNTER — Encounter (HOSPITAL_COMMUNITY): Payer: Self-pay

## 2017-07-12 DIAGNOSIS — R430 Anosmia: Secondary | ICD-10-CM | POA: Diagnosis not present

## 2017-07-12 DIAGNOSIS — J329 Chronic sinusitis, unspecified: Secondary | ICD-10-CM | POA: Diagnosis not present

## 2017-07-13 ENCOUNTER — Other Ambulatory Visit: Payer: Self-pay | Admitting: Family Medicine

## 2017-07-13 DIAGNOSIS — Z1231 Encounter for screening mammogram for malignant neoplasm of breast: Secondary | ICD-10-CM

## 2017-07-24 ENCOUNTER — Ambulatory Visit
Admission: RE | Admit: 2017-07-24 | Discharge: 2017-07-24 | Disposition: A | Discharge status: Home / Self Care | Payer: Medicare HMO | Source: Ambulatory Visit | Attending: Family Medicine | Admitting: Family Medicine

## 2017-07-24 DIAGNOSIS — Z1231 Encounter for screening mammogram for malignant neoplasm of breast: Secondary | ICD-10-CM

## 2017-08-03 DIAGNOSIS — E1121 Type 2 diabetes mellitus with diabetic nephropathy: Secondary | ICD-10-CM | POA: Diagnosis not present

## 2017-08-03 DIAGNOSIS — Z7984 Long term (current) use of oral hypoglycemic drugs: Secondary | ICD-10-CM | POA: Diagnosis not present

## 2017-08-03 DIAGNOSIS — K219 Gastro-esophageal reflux disease without esophagitis: Secondary | ICD-10-CM | POA: Diagnosis not present

## 2017-08-03 DIAGNOSIS — J309 Allergic rhinitis, unspecified: Secondary | ICD-10-CM | POA: Diagnosis not present

## 2017-08-03 DIAGNOSIS — E785 Hyperlipidemia, unspecified: Secondary | ICD-10-CM | POA: Diagnosis not present

## 2017-08-03 DIAGNOSIS — E1142 Type 2 diabetes mellitus with diabetic polyneuropathy: Secondary | ICD-10-CM | POA: Diagnosis not present

## 2017-08-03 DIAGNOSIS — N183 Chronic kidney disease, stage 3 (moderate): Secondary | ICD-10-CM | POA: Diagnosis not present

## 2017-08-03 DIAGNOSIS — I129 Hypertensive chronic kidney disease with stage 1 through stage 4 chronic kidney disease, or unspecified chronic kidney disease: Secondary | ICD-10-CM | POA: Diagnosis not present

## 2017-08-07 DIAGNOSIS — K219 Gastro-esophageal reflux disease without esophagitis: Secondary | ICD-10-CM | POA: Diagnosis not present

## 2017-08-07 DIAGNOSIS — E1121 Type 2 diabetes mellitus with diabetic nephropathy: Secondary | ICD-10-CM | POA: Diagnosis not present

## 2017-08-07 DIAGNOSIS — Z7984 Long term (current) use of oral hypoglycemic drugs: Secondary | ICD-10-CM | POA: Diagnosis not present

## 2017-08-08 ENCOUNTER — Ambulatory Visit (INDEPENDENT_AMBULATORY_CARE_PROVIDER_SITE_OTHER): Payer: Medicare HMO | Admitting: Allergy and Immunology

## 2017-08-08 ENCOUNTER — Encounter: Payer: Self-pay | Admitting: Allergy and Immunology

## 2017-08-08 VITALS — BP 132/90 | HR 80 | Resp 18

## 2017-08-08 DIAGNOSIS — H6983 Other specified disorders of Eustachian tube, bilateral: Secondary | ICD-10-CM | POA: Diagnosis not present

## 2017-08-08 DIAGNOSIS — J3089 Other allergic rhinitis: Secondary | ICD-10-CM | POA: Diagnosis not present

## 2017-08-08 DIAGNOSIS — K219 Gastro-esophageal reflux disease without esophagitis: Secondary | ICD-10-CM

## 2017-08-08 MED ORDER — RANITIDINE HCL 300 MG PO TABS: 300.0000 mg | ORAL_TABLET | Freq: Every day | ORAL | 5 refills | Status: DC

## 2017-08-08 NOTE — Progress Notes (Signed)
Follow-up Note  Referring Provider: Ronnell Guadalajara* Primary Provider: Lottie Dawson, MD Date of Office Visit: 08/08/2017  Subjective:   Rebecca Harmon (DOB: 1946-02-16) is a 71 y.o. female who returns to the [facility] on 08/08/2017 in re-evaluation of the following:  HPI: Rebecca Harmon returns to this clinic in reevaluation of her ear and airway issue. I last saw her in this clinic during her initial evaluation of 07/04/2017.  Although her nose has really done very well she still continues to have issues with her ears. She still feels as though they are opening and closing and she still has muffled hearing that will clear intermittently for a few seconds but reverts back to muffled hearing most hours of the day.  She does have reflux that she thinks is under good control with omeprazole.  Allergies as of 08/08/2017      Reactions   Codeine Other (See Comments)   dizzy   Tetanus Toxoid    Vytorin [ezetimibe-simvastatin] Other (See Comments)   UNSPECIFIED      Medication List      aspirin EC 81 MG tablet Take 81 mg by mouth daily.   gabapentin 100 MG capsule Commonly known as:  NEURONTIN Take 100 mg by mouth as needed.   hydrochlorothiazide 25 MG tablet Commonly known as:  HYDRODIURIL   metFORMIN 1000 MG tablet Commonly known as:  GLUCOPHAGE Take 1,000 mg by mouth 2 (two) times daily.   metoprolol succinate 100 MG 24 hr tablet Commonly known as:  TOPROL-XL   montelukast 10 MG tablet Commonly known as:  SINGULAIR Take 1 tablet (10 mg total) by mouth at bedtime.   NASACORT ALLERGY 24HR 55 MCG/ACT Aero nasal inhaler Generic drug:  triamcinolone Place 1 spray into the nose daily.   omeprazole 40 MG capsule Commonly known as:  PRILOSEC Take 40 mg by mouth every morning.   ONE TOUCH ULTRA TEST test strip Generic drug:  glucose blood       Past Medical History:  Diagnosis Date  . Allergy    SEASONAL  . Chest pain  2-13 2012   echo EF 50-55% read by Dr. Claiborne Billings pt. of Dr. Ellyn Hack  . Colon polyp    adenomatous  . Diabetes mellitus without complication ([place])   . Dizziness 03-06-2013   stress test Read by Dr. Gwenlyn Found, normal stress test  . Fatty liver   . GERD (gastroesophageal reflux disease)   . Hyperlipidemia   . Hypertension     Past Surgical History:  Procedure Laterality Date  . ABDOMINAL HYSTERECTOMY    . APPENDECTOMY    . BREAST EXCISIONAL BIOPSY Left   . CHOLECYSTECTOMY    . COLONOSCOPY    . THYROIDECTOMY, PARTIAL    . TONSILLECTOMY     x2    Review of systems negative except as noted in HPI / PMHx or noted below:  Review of Systems  Constitutional: Negative.   HENT: Negative.   Eyes: Negative.   Respiratory: Negative.   Cardiovascular: Negative.   Gastrointestinal: Negative.   Genitourinary: Negative.   Musculoskeletal: Negative.   Skin: Negative.   Neurological: Negative.   Endo/Heme/Allergies: Negative.   Psychiatric/Behavioral: Negative.      Objective:   Vitals:   08/08/17 1021  BP: 132/90  Pulse: 80  Resp: 18          Physical Exam  Constitutional: She is well-developed, well-nourished, and in no distress.  HENT:  Head: Normocephalic.  Right  Ear: External ear normal. A foreign body (tube) is present. Tympanic membrane is scarred.  Left Ear: External ear and ear canal normal. Tympanic membrane is scarred.  Nose: Nose normal. No mucosal edema or rhinorrhea.  Mouth/Throat: Uvula is midline, oropharynx is clear and moist and mucous membranes are normal. No oropharyngeal exudate.  Eyes: Conjunctivae are normal.  Neck: Trachea normal. No tracheal tenderness present. No tracheal deviation present. No thyromegaly present.  Cardiovascular: Normal rate, regular rhythm, S1 normal, S2 normal and normal heart sounds.   No murmur heard. Pulmonary/Chest: Breath sounds normal. No stridor. No respiratory distress. She has no wheezes. She has no rales.  Musculoskeletal:  She exhibits no edema.  Lymphadenopathy:       Head (right side): No tonsillar adenopathy present.       Head (left side): No tonsillar adenopathy present.    She has no cervical adenopathy.  Neurological: She is alert. Gait normal.  Skin: No rash noted. She is not diaphoretic. No erythema. Nails show no clubbing.  Psychiatric: Mood and affect normal.    Diagnostics: Results of a sinus CT scan obtained 07/12/2017 identified the following:  Sinuses: Minimal mucosal thickening in the floor the left maxillary sinus. Otherwise paranasal sinuses are clear. No air-fluid levels.  Assessment and Plan:   1. Other allergic rhinitis   2. ETD (Eustachian tube dysfunction), bilateral   3. Gastroesophageal reflux disease, esophagitis presence not specified     1. Treat reflux:   A. continue omeprazole 40 mg in a.m.  B. start ranitidine 300 mg in PM  C. consolidate caffeine as much as possible  2. Continue to Treat and prevent inflammation:   A. OTC Nasacort - one spray each nostril one time per day  B. montelukast 10 mg - one tablet one time per day  3. If needed:   A. Cetirizine 10mg  one tablet one time per day  4. Return to clinic in 12 weeks or earlier if problem  Rebecca Harmon will now utilize aggressive therapy directed against reflux with the assumption that reflux may be one of the triggers giving rise to her eustachian tube dysfunction and hearing abnormalities. She will continue on anti-inflammatory agents for her respiratory tract as noted above. I will see her back in this clinic in 12 weeks or earlier if there is a problem.  Allena Katz, MD Allergy / Immunology [place]

## 2017-08-08 NOTE — Patient Instructions (Addendum)
  1. Treat reflux:   A. continue omeprazole 40 mg in a.m.  B. start ranitidine 300 mg in PM  C. consolidate caffeine as much as possible  2. Continue to Treat and prevent inflammation:   A. OTC Nasacort - one spray each nostril one time per day  B. montelukast 10 mg - one tablet one time per day  3. If needed:   A. Cetirizine 10mg  one tablet one time per day  4. Return to clinic in 12 weeks or earlier if problem

## 2017-08-29 DIAGNOSIS — R69 Illness, unspecified: Secondary | ICD-10-CM | POA: Diagnosis not present

## 2017-09-05 DIAGNOSIS — H25013 Cortical age-related cataract, bilateral: Secondary | ICD-10-CM | POA: Diagnosis not present

## 2017-09-05 DIAGNOSIS — H02839 Dermatochalasis of unspecified eye, unspecified eyelid: Secondary | ICD-10-CM | POA: Diagnosis not present

## 2017-09-05 DIAGNOSIS — H25043 Posterior subcapsular polar age-related cataract, bilateral: Secondary | ICD-10-CM | POA: Diagnosis not present

## 2017-09-05 DIAGNOSIS — H2513 Age-related nuclear cataract, bilateral: Secondary | ICD-10-CM | POA: Diagnosis not present

## 2017-09-05 DIAGNOSIS — H2512 Age-related nuclear cataract, left eye: Secondary | ICD-10-CM | POA: Diagnosis not present

## 2017-10-23 DIAGNOSIS — H2512 Age-related nuclear cataract, left eye: Secondary | ICD-10-CM | POA: Diagnosis not present

## 2017-10-23 DIAGNOSIS — Z961 Presence of intraocular lens: Secondary | ICD-10-CM | POA: Diagnosis not present

## 2017-10-24 DIAGNOSIS — H2511 Age-related nuclear cataract, right eye: Secondary | ICD-10-CM | POA: Diagnosis not present

## 2017-10-31 DIAGNOSIS — H2511 Age-related nuclear cataract, right eye: Secondary | ICD-10-CM | POA: Diagnosis not present

## 2017-10-31 DIAGNOSIS — Z961 Presence of intraocular lens: Secondary | ICD-10-CM | POA: Diagnosis not present

## 2017-11-13 DIAGNOSIS — H524 Presbyopia: Secondary | ICD-10-CM | POA: Diagnosis not present

## 2017-11-13 DIAGNOSIS — H5201 Hypermetropia, right eye: Secondary | ICD-10-CM | POA: Diagnosis not present

## 2017-11-13 DIAGNOSIS — H52223 Regular astigmatism, bilateral: Secondary | ICD-10-CM | POA: Diagnosis not present

## 2017-11-13 DIAGNOSIS — Z961 Presence of intraocular lens: Secondary | ICD-10-CM | POA: Diagnosis not present

## 2017-11-13 DIAGNOSIS — H2511 Age-related nuclear cataract, right eye: Secondary | ICD-10-CM | POA: Diagnosis not present

## 2017-11-22 DIAGNOSIS — R69 Illness, unspecified: Secondary | ICD-10-CM | POA: Diagnosis not present

## 2017-11-23 DIAGNOSIS — Z961 Presence of intraocular lens: Secondary | ICD-10-CM | POA: Diagnosis not present

## 2017-12-01 ENCOUNTER — Encounter: Payer: Self-pay | Admitting: Internal Medicine

## 2017-12-07 DIAGNOSIS — Z961 Presence of intraocular lens: Secondary | ICD-10-CM | POA: Diagnosis not present

## 2017-12-13 DIAGNOSIS — Z0100 Encounter for examination of eyes and vision without abnormal findings: Secondary | ICD-10-CM | POA: Diagnosis not present

## 2017-12-19 DIAGNOSIS — H9211 Otorrhea, right ear: Secondary | ICD-10-CM | POA: Diagnosis not present

## 2017-12-19 DIAGNOSIS — H6981 Other specified disorders of Eustachian tube, right ear: Secondary | ICD-10-CM | POA: Diagnosis not present

## 2017-12-19 DIAGNOSIS — H9113 Presbycusis, bilateral: Secondary | ICD-10-CM | POA: Diagnosis not present

## 2017-12-19 DIAGNOSIS — H90A31 Mixed conductive and sensorineural hearing loss, unilateral, right ear with restricted hearing on the contralateral side: Secondary | ICD-10-CM | POA: Diagnosis not present

## 2017-12-26 DIAGNOSIS — H90A31 Mixed conductive and sensorineural hearing loss, unilateral, right ear with restricted hearing on the contralateral side: Secondary | ICD-10-CM | POA: Diagnosis not present

## 2017-12-26 DIAGNOSIS — H9211 Otorrhea, right ear: Secondary | ICD-10-CM | POA: Diagnosis not present

## 2017-12-26 DIAGNOSIS — H6981 Other specified disorders of Eustachian tube, right ear: Secondary | ICD-10-CM | POA: Diagnosis not present

## 2017-12-29 DIAGNOSIS — H9211 Otorrhea, right ear: Secondary | ICD-10-CM | POA: Diagnosis not present

## 2017-12-29 DIAGNOSIS — H90A31 Mixed conductive and sensorineural hearing loss, unilateral, right ear with restricted hearing on the contralateral side: Secondary | ICD-10-CM | POA: Diagnosis not present

## 2017-12-29 DIAGNOSIS — H6981 Other specified disorders of Eustachian tube, right ear: Secondary | ICD-10-CM | POA: Diagnosis not present

## 2018-01-19 DIAGNOSIS — H9211 Otorrhea, right ear: Secondary | ICD-10-CM | POA: Diagnosis not present

## 2018-01-19 DIAGNOSIS — H6981 Other specified disorders of Eustachian tube, right ear: Secondary | ICD-10-CM | POA: Diagnosis not present

## 2018-02-02 DIAGNOSIS — H90A31 Mixed conductive and sensorineural hearing loss, unilateral, right ear with restricted hearing on the contralateral side: Secondary | ICD-10-CM | POA: Diagnosis not present

## 2018-02-02 DIAGNOSIS — H6981 Other specified disorders of Eustachian tube, right ear: Secondary | ICD-10-CM | POA: Diagnosis not present

## 2018-02-02 DIAGNOSIS — H9211 Otorrhea, right ear: Secondary | ICD-10-CM | POA: Diagnosis not present

## 2018-02-19 DIAGNOSIS — Z1159 Encounter for screening for other viral diseases: Secondary | ICD-10-CM | POA: Diagnosis not present

## 2018-02-19 DIAGNOSIS — E785 Hyperlipidemia, unspecified: Secondary | ICD-10-CM | POA: Diagnosis not present

## 2018-02-19 DIAGNOSIS — I129 Hypertensive chronic kidney disease with stage 1 through stage 4 chronic kidney disease, or unspecified chronic kidney disease: Secondary | ICD-10-CM | POA: Diagnosis not present

## 2018-02-19 DIAGNOSIS — R5382 Chronic fatigue, unspecified: Secondary | ICD-10-CM | POA: Diagnosis not present

## 2018-02-19 DIAGNOSIS — K589 Irritable bowel syndrome without diarrhea: Secondary | ICD-10-CM | POA: Diagnosis not present

## 2018-02-19 DIAGNOSIS — J309 Allergic rhinitis, unspecified: Secondary | ICD-10-CM | POA: Diagnosis not present

## 2018-02-19 DIAGNOSIS — E1121 Type 2 diabetes mellitus with diabetic nephropathy: Secondary | ICD-10-CM | POA: Diagnosis not present

## 2018-02-19 DIAGNOSIS — H9211 Otorrhea, right ear: Secondary | ICD-10-CM | POA: Diagnosis not present

## 2018-02-19 DIAGNOSIS — N183 Chronic kidney disease, stage 3 (moderate): Secondary | ICD-10-CM | POA: Diagnosis not present

## 2018-02-19 DIAGNOSIS — E1142 Type 2 diabetes mellitus with diabetic polyneuropathy: Secondary | ICD-10-CM | POA: Diagnosis not present

## 2018-02-19 DIAGNOSIS — M189 Osteoarthritis of first carpometacarpal joint, unspecified: Secondary | ICD-10-CM | POA: Diagnosis not present

## 2018-02-19 DIAGNOSIS — Z0000 Encounter for general adult medical examination without abnormal findings: Secondary | ICD-10-CM | POA: Diagnosis not present

## 2018-02-20 DIAGNOSIS — Z780 Asymptomatic menopausal state: Secondary | ICD-10-CM | POA: Diagnosis not present

## 2018-03-08 DIAGNOSIS — H9211 Otorrhea, right ear: Secondary | ICD-10-CM | POA: Diagnosis not present

## 2018-03-19 DIAGNOSIS — H6981 Other specified disorders of Eustachian tube, right ear: Secondary | ICD-10-CM | POA: Diagnosis not present

## 2018-03-19 DIAGNOSIS — H90A31 Mixed conductive and sensorineural hearing loss, unilateral, right ear with restricted hearing on the contralateral side: Secondary | ICD-10-CM | POA: Diagnosis not present

## 2018-03-19 DIAGNOSIS — H9211 Otorrhea, right ear: Secondary | ICD-10-CM | POA: Diagnosis not present

## 2018-03-20 DIAGNOSIS — R69 Illness, unspecified: Secondary | ICD-10-CM | POA: Diagnosis not present

## 2018-04-02 DIAGNOSIS — H90A31 Mixed conductive and sensorineural hearing loss, unilateral, right ear with restricted hearing on the contralateral side: Secondary | ICD-10-CM | POA: Diagnosis not present

## 2018-04-02 DIAGNOSIS — H9211 Otorrhea, right ear: Secondary | ICD-10-CM | POA: Diagnosis not present

## 2018-04-19 DIAGNOSIS — H90A31 Mixed conductive and sensorineural hearing loss, unilateral, right ear with restricted hearing on the contralateral side: Secondary | ICD-10-CM | POA: Diagnosis not present

## 2018-04-19 DIAGNOSIS — H9211 Otorrhea, right ear: Secondary | ICD-10-CM | POA: Diagnosis not present

## 2018-05-24 DIAGNOSIS — H90A31 Mixed conductive and sensorineural hearing loss, unilateral, right ear with restricted hearing on the contralateral side: Secondary | ICD-10-CM | POA: Diagnosis not present

## 2018-05-24 DIAGNOSIS — H9211 Otorrhea, right ear: Secondary | ICD-10-CM | POA: Diagnosis not present

## 2018-05-27 DIAGNOSIS — R69 Illness, unspecified: Secondary | ICD-10-CM | POA: Diagnosis not present

## 2018-05-28 DIAGNOSIS — R079 Chest pain, unspecified: Secondary | ICD-10-CM | POA: Diagnosis not present

## 2018-05-28 DIAGNOSIS — I129 Hypertensive chronic kidney disease with stage 1 through stage 4 chronic kidney disease, or unspecified chronic kidney disease: Secondary | ICD-10-CM | POA: Diagnosis not present

## 2018-06-04 DIAGNOSIS — Z961 Presence of intraocular lens: Secondary | ICD-10-CM | POA: Diagnosis not present

## 2018-06-04 DIAGNOSIS — H18419 Arcus senilis, unspecified eye: Secondary | ICD-10-CM | POA: Diagnosis not present

## 2018-06-04 DIAGNOSIS — H524 Presbyopia: Secondary | ICD-10-CM | POA: Diagnosis not present

## 2018-06-04 DIAGNOSIS — H5201 Hypermetropia, right eye: Secondary | ICD-10-CM | POA: Diagnosis not present

## 2018-06-04 DIAGNOSIS — Z7984 Long term (current) use of oral hypoglycemic drugs: Secondary | ICD-10-CM | POA: Diagnosis not present

## 2018-06-04 DIAGNOSIS — I10 Essential (primary) hypertension: Secondary | ICD-10-CM | POA: Diagnosis not present

## 2018-06-04 DIAGNOSIS — H52223 Regular astigmatism, bilateral: Secondary | ICD-10-CM | POA: Diagnosis not present

## 2018-06-04 DIAGNOSIS — E119 Type 2 diabetes mellitus without complications: Secondary | ICD-10-CM | POA: Diagnosis not present

## 2018-07-26 DIAGNOSIS — H16041 Marginal corneal ulcer, right eye: Secondary | ICD-10-CM | POA: Diagnosis not present

## 2018-07-27 DIAGNOSIS — H16043 Marginal corneal ulcer, bilateral: Secondary | ICD-10-CM | POA: Diagnosis not present

## 2018-08-23 DIAGNOSIS — N183 Chronic kidney disease, stage 3 (moderate): Secondary | ICD-10-CM | POA: Diagnosis not present

## 2018-08-23 DIAGNOSIS — E1142 Type 2 diabetes mellitus with diabetic polyneuropathy: Secondary | ICD-10-CM | POA: Diagnosis not present

## 2018-08-23 DIAGNOSIS — E785 Hyperlipidemia, unspecified: Secondary | ICD-10-CM | POA: Diagnosis not present

## 2018-08-23 DIAGNOSIS — E1121 Type 2 diabetes mellitus with diabetic nephropathy: Secondary | ICD-10-CM | POA: Diagnosis not present

## 2018-08-23 DIAGNOSIS — I129 Hypertensive chronic kidney disease with stage 1 through stage 4 chronic kidney disease, or unspecified chronic kidney disease: Secondary | ICD-10-CM | POA: Diagnosis not present

## 2018-08-23 DIAGNOSIS — Z889 Allergy status to unspecified drugs, medicaments and biological substances status: Secondary | ICD-10-CM | POA: Diagnosis not present

## 2018-11-01 DIAGNOSIS — R69 Illness, unspecified: Secondary | ICD-10-CM | POA: Diagnosis not present

## 2018-12-06 DIAGNOSIS — H9211 Otorrhea, right ear: Secondary | ICD-10-CM | POA: Diagnosis not present

## 2018-12-06 DIAGNOSIS — H90A31 Mixed conductive and sensorineural hearing loss, unilateral, right ear with restricted hearing on the contralateral side: Secondary | ICD-10-CM | POA: Diagnosis not present

## 2018-12-06 DIAGNOSIS — H7291 Unspecified perforation of tympanic membrane, right ear: Secondary | ICD-10-CM | POA: Diagnosis not present

## 2018-12-31 DIAGNOSIS — R69 Illness, unspecified: Secondary | ICD-10-CM | POA: Diagnosis not present

## 2019-03-12 DIAGNOSIS — R69 Illness, unspecified: Secondary | ICD-10-CM | POA: Diagnosis not present

## 2019-05-14 DIAGNOSIS — L821 Other seborrheic keratosis: Secondary | ICD-10-CM | POA: Diagnosis not present

## 2019-05-14 DIAGNOSIS — L820 Inflamed seborrheic keratosis: Secondary | ICD-10-CM | POA: Diagnosis not present

## 2019-05-14 DIAGNOSIS — L570 Actinic keratosis: Secondary | ICD-10-CM | POA: Diagnosis not present

## 2019-05-18 DIAGNOSIS — R69 Illness, unspecified: Secondary | ICD-10-CM | POA: Diagnosis not present

## 2019-05-23 DIAGNOSIS — R5382 Chronic fatigue, unspecified: Secondary | ICD-10-CM | POA: Diagnosis not present

## 2019-05-23 DIAGNOSIS — K219 Gastro-esophageal reflux disease without esophagitis: Secondary | ICD-10-CM | POA: Diagnosis not present

## 2019-05-23 DIAGNOSIS — R079 Chest pain, unspecified: Secondary | ICD-10-CM | POA: Diagnosis not present

## 2019-07-11 DIAGNOSIS — H52223 Regular astigmatism, bilateral: Secondary | ICD-10-CM | POA: Diagnosis not present

## 2019-07-11 DIAGNOSIS — I10 Essential (primary) hypertension: Secondary | ICD-10-CM | POA: Diagnosis not present

## 2019-07-11 DIAGNOSIS — E119 Type 2 diabetes mellitus without complications: Secondary | ICD-10-CM | POA: Diagnosis not present

## 2019-07-11 DIAGNOSIS — H524 Presbyopia: Secondary | ICD-10-CM | POA: Diagnosis not present

## 2019-07-11 DIAGNOSIS — H5201 Hypermetropia, right eye: Secondary | ICD-10-CM | POA: Diagnosis not present

## 2019-07-11 DIAGNOSIS — Z961 Presence of intraocular lens: Secondary | ICD-10-CM | POA: Diagnosis not present

## 2019-07-11 DIAGNOSIS — H18419 Arcus senilis, unspecified eye: Secondary | ICD-10-CM | POA: Diagnosis not present

## 2019-07-11 DIAGNOSIS — Z7984 Long term (current) use of oral hypoglycemic drugs: Secondary | ICD-10-CM | POA: Diagnosis not present

## 2019-07-31 DIAGNOSIS — R69 Illness, unspecified: Secondary | ICD-10-CM | POA: Diagnosis not present

## 2019-08-09 DIAGNOSIS — I129 Hypertensive chronic kidney disease with stage 1 through stage 4 chronic kidney disease, or unspecified chronic kidney disease: Secondary | ICD-10-CM | POA: Diagnosis not present

## 2019-08-09 DIAGNOSIS — E1121 Type 2 diabetes mellitus with diabetic nephropathy: Secondary | ICD-10-CM | POA: Diagnosis not present

## 2019-08-09 DIAGNOSIS — Z889 Allergy status to unspecified drugs, medicaments and biological substances status: Secondary | ICD-10-CM | POA: Diagnosis not present

## 2019-08-09 DIAGNOSIS — K219 Gastro-esophageal reflux disease without esophagitis: Secondary | ICD-10-CM | POA: Diagnosis not present

## 2019-08-09 DIAGNOSIS — E785 Hyperlipidemia, unspecified: Secondary | ICD-10-CM | POA: Diagnosis not present

## 2019-08-09 DIAGNOSIS — N183 Chronic kidney disease, stage 3 (moderate): Secondary | ICD-10-CM | POA: Diagnosis not present

## 2019-08-09 DIAGNOSIS — E1142 Type 2 diabetes mellitus with diabetic polyneuropathy: Secondary | ICD-10-CM | POA: Diagnosis not present

## 2019-09-26 DIAGNOSIS — R69 Illness, unspecified: Secondary | ICD-10-CM | POA: Diagnosis not present

## 2019-11-13 DIAGNOSIS — E1142 Type 2 diabetes mellitus with diabetic polyneuropathy: Secondary | ICD-10-CM | POA: Diagnosis not present

## 2019-12-23 DIAGNOSIS — M79672 Pain in left foot: Secondary | ICD-10-CM | POA: Diagnosis not present

## 2020-01-06 DIAGNOSIS — R69 Illness, unspecified: Secondary | ICD-10-CM | POA: Diagnosis not present

## 2020-01-28 DIAGNOSIS — Z961 Presence of intraocular lens: Secondary | ICD-10-CM | POA: Diagnosis not present

## 2020-01-28 DIAGNOSIS — H1033 Unspecified acute conjunctivitis, bilateral: Secondary | ICD-10-CM | POA: Diagnosis not present

## 2020-02-17 DIAGNOSIS — M549 Dorsalgia, unspecified: Secondary | ICD-10-CM | POA: Diagnosis not present

## 2020-02-18 DIAGNOSIS — Z90710 Acquired absence of both cervix and uterus: Secondary | ICD-10-CM | POA: Diagnosis not present

## 2020-02-18 DIAGNOSIS — M546 Pain in thoracic spine: Secondary | ICD-10-CM | POA: Diagnosis not present

## 2020-02-18 DIAGNOSIS — M8580 Other specified disorders of bone density and structure, unspecified site: Secondary | ICD-10-CM | POA: Diagnosis not present

## 2020-02-18 DIAGNOSIS — E119 Type 2 diabetes mellitus without complications: Secondary | ICD-10-CM | POA: Diagnosis not present

## 2020-03-04 DIAGNOSIS — Z1389 Encounter for screening for other disorder: Secondary | ICD-10-CM | POA: Diagnosis not present

## 2020-03-04 DIAGNOSIS — K219 Gastro-esophageal reflux disease without esophagitis: Secondary | ICD-10-CM | POA: Diagnosis not present

## 2020-03-04 DIAGNOSIS — I129 Hypertensive chronic kidney disease with stage 1 through stage 4 chronic kidney disease, or unspecified chronic kidney disease: Secondary | ICD-10-CM | POA: Diagnosis not present

## 2020-03-04 DIAGNOSIS — M549 Dorsalgia, unspecified: Secondary | ICD-10-CM | POA: Diagnosis not present

## 2020-03-04 DIAGNOSIS — K589 Irritable bowel syndrome without diarrhea: Secondary | ICD-10-CM | POA: Diagnosis not present

## 2020-03-04 DIAGNOSIS — J309 Allergic rhinitis, unspecified: Secondary | ICD-10-CM | POA: Diagnosis not present

## 2020-03-04 DIAGNOSIS — E785 Hyperlipidemia, unspecified: Secondary | ICD-10-CM | POA: Diagnosis not present

## 2020-03-04 DIAGNOSIS — N183 Chronic kidney disease, stage 3 unspecified: Secondary | ICD-10-CM | POA: Diagnosis not present

## 2020-03-04 DIAGNOSIS — E1121 Type 2 diabetes mellitus with diabetic nephropathy: Secondary | ICD-10-CM | POA: Diagnosis not present

## 2020-03-04 DIAGNOSIS — Z0000 Encounter for general adult medical examination without abnormal findings: Secondary | ICD-10-CM | POA: Diagnosis not present

## 2020-03-30 DIAGNOSIS — R69 Illness, unspecified: Secondary | ICD-10-CM | POA: Diagnosis not present

## 2020-04-19 DIAGNOSIS — Z20828 Contact with and (suspected) exposure to other viral communicable diseases: Secondary | ICD-10-CM | POA: Diagnosis not present

## 2020-04-19 DIAGNOSIS — Z03818 Encounter for observation for suspected exposure to other biological agents ruled out: Secondary | ICD-10-CM | POA: Diagnosis not present

## 2020-06-01 DIAGNOSIS — R69 Illness, unspecified: Secondary | ICD-10-CM | POA: Diagnosis not present

## 2020-07-02 DIAGNOSIS — L570 Actinic keratosis: Secondary | ICD-10-CM | POA: Diagnosis not present

## 2020-07-02 DIAGNOSIS — L820 Inflamed seborrheic keratosis: Secondary | ICD-10-CM | POA: Diagnosis not present

## 2020-07-02 DIAGNOSIS — L821 Other seborrheic keratosis: Secondary | ICD-10-CM | POA: Diagnosis not present

## 2020-07-02 DIAGNOSIS — B353 Tinea pedis: Secondary | ICD-10-CM | POA: Diagnosis not present

## 2020-07-28 DIAGNOSIS — H5201 Hypermetropia, right eye: Secondary | ICD-10-CM | POA: Diagnosis not present

## 2020-07-28 DIAGNOSIS — Z7984 Long term (current) use of oral hypoglycemic drugs: Secondary | ICD-10-CM | POA: Diagnosis not present

## 2020-07-28 DIAGNOSIS — Z961 Presence of intraocular lens: Secondary | ICD-10-CM | POA: Diagnosis not present

## 2020-07-28 DIAGNOSIS — H18419 Arcus senilis, unspecified eye: Secondary | ICD-10-CM | POA: Diagnosis not present

## 2020-07-28 DIAGNOSIS — H52223 Regular astigmatism, bilateral: Secondary | ICD-10-CM | POA: Diagnosis not present

## 2020-07-28 DIAGNOSIS — E119 Type 2 diabetes mellitus without complications: Secondary | ICD-10-CM | POA: Diagnosis not present

## 2020-07-28 DIAGNOSIS — H524 Presbyopia: Secondary | ICD-10-CM | POA: Diagnosis not present

## 2020-07-28 DIAGNOSIS — I10 Essential (primary) hypertension: Secondary | ICD-10-CM | POA: Diagnosis not present

## 2020-08-24 DIAGNOSIS — R69 Illness, unspecified: Secondary | ICD-10-CM | POA: Diagnosis not present

## 2020-08-31 DIAGNOSIS — N183 Chronic kidney disease, stage 3 unspecified: Secondary | ICD-10-CM | POA: Diagnosis not present

## 2020-08-31 DIAGNOSIS — E1142 Type 2 diabetes mellitus with diabetic polyneuropathy: Secondary | ICD-10-CM | POA: Diagnosis not present

## 2020-08-31 DIAGNOSIS — Z889 Allergy status to unspecified drugs, medicaments and biological substances status: Secondary | ICD-10-CM | POA: Diagnosis not present

## 2020-08-31 DIAGNOSIS — I129 Hypertensive chronic kidney disease with stage 1 through stage 4 chronic kidney disease, or unspecified chronic kidney disease: Secondary | ICD-10-CM | POA: Diagnosis not present

## 2020-08-31 DIAGNOSIS — E1121 Type 2 diabetes mellitus with diabetic nephropathy: Secondary | ICD-10-CM | POA: Diagnosis not present

## 2020-08-31 DIAGNOSIS — E785 Hyperlipidemia, unspecified: Secondary | ICD-10-CM | POA: Diagnosis not present

## 2020-09-16 DIAGNOSIS — R300 Dysuria: Secondary | ICD-10-CM | POA: Diagnosis not present

## 2020-10-31 DIAGNOSIS — R69 Illness, unspecified: Secondary | ICD-10-CM | POA: Diagnosis not present

## 2020-11-13 DIAGNOSIS — N183 Chronic kidney disease, stage 3 unspecified: Secondary | ICD-10-CM | POA: Diagnosis not present

## 2020-11-13 DIAGNOSIS — R002 Palpitations: Secondary | ICD-10-CM | POA: Diagnosis not present

## 2020-11-13 DIAGNOSIS — Z131 Encounter for screening for diabetes mellitus: Secondary | ICD-10-CM | POA: Diagnosis not present

## 2020-11-13 DIAGNOSIS — M1990 Unspecified osteoarthritis, unspecified site: Secondary | ICD-10-CM | POA: Diagnosis not present

## 2020-11-13 DIAGNOSIS — E1121 Type 2 diabetes mellitus with diabetic nephropathy: Secondary | ICD-10-CM | POA: Diagnosis not present

## 2020-11-13 DIAGNOSIS — E1142 Type 2 diabetes mellitus with diabetic polyneuropathy: Secondary | ICD-10-CM | POA: Diagnosis not present

## 2020-11-16 ENCOUNTER — Encounter: Payer: Self-pay | Admitting: Neurology

## 2021-01-27 NOTE — Progress Notes (Signed)
[facility] Clinic Note - Initial Visit   Date: 01/28/21  TERREL MANALO MRN: [id number] DOB: 07-Oct-1946   Dear Dr. Laurann Montana:   Thank you for your kind referral of CORINTHIAN KEMLER for consultation of neuropathy. Although her history is well known to you, please allow [place] to reiterate it for the purpose of our medical record. The patient was accompanied to the clinic by self.    History of Present Illness: VERNICE MANNINA is a 75 y.o. right-handed female with diabetes mellitus (HbA1c 8.3), hypertension, and GERD presenting for evaluation of numbness/tingling of the hands and feet.   Starting around 2017, she began having tingling and numbness of the toes which has slowly extended into the entire feet and lower legs. She has predominately numbness and tight sensation, as if a ruberband was squeezing.  It bothers her more when she is resting and in the evening. She has imbalance, does not use a cane and has not had any falls.   Late 2021, she began having tingling involving the 3rd and 4th fingertips of both hands, which is constant.  She also has a cold sensation as if the hands frozen.  No hand weakness.   She intermittently takes gabapentin 200mg  at bedtime which helps her falls asleep, although has not noticed any change in her symptoms.   She has been diabetic for many years with HbA1c ranging ~7.  Recently, she had medication change and was noted to be higher at 8.3.  She does not drink alcohol.  Mother had diabetic neuropathy.   Out-side paper records, electronic medical record, and images have been reviewed where available and summarized as:  \ Lab Results  Component Value Date   ESRSEDRATE 22 03/07/2016    Past Medical History:  Diagnosis Date  . Allergy    SEASONAL  . Chest pain 2-13 2012   echo EF 50-55% read by Dr. Claiborne Billings pt. of Dr. Ellyn Hack  . Colon polyp    adenomatous  . Diabetes mellitus without complication ([place])   .  Dizziness 03-06-2013   stress test Read by Dr. Gwenlyn Found, normal stress test  . Fatty liver   . GERD (gastroesophageal reflux disease)   . Hyperlipidemia   . Hypertension     Past Surgical History:  Procedure Laterality Date  . ABDOMINAL HYSTERECTOMY    . APPENDECTOMY    . BREAST EXCISIONAL BIOPSY Left   . CHOLECYSTECTOMY    . COLONOSCOPY    . THYROIDECTOMY, PARTIAL    . TONSILLECTOMY     x2     Medications:  Outpatient Encounter Medications as of 01/28/2021  Medication Sig Note  . aspirin EC 81 MG tablet Take 81 mg by mouth daily.   Marland Kitchen gabapentin (NEURONTIN) 100 MG capsule Take 100 mg by mouth as needed.   . hydrochlorothiazide (HYDRODIURIL) 25 MG tablet  07/28/2015: Received from: External Pharmacy  . losartan (COZAAR) 50 MG tablet Take 1 tablet by mouth daily.   . metFORMIN (GLUCOPHAGE) 1000 MG tablet Take 1,000 mg by mouth daily. 07/27/2016: Received from: External Pharmacy  . metoprolol succinate (TOPROL-XL) 100 MG 24 hr tablet  07/28/2015: Received from: External Pharmacy  . Multiple Vitamins-Minerals (CENTRUM SILVER 50+WOMEN PO) Take by mouth.   Marland Kitchen omeprazole (PRILOSEC) 40 MG capsule Take 40 mg by mouth every morning.  07/28/2015: Received from: External Pharmacy  . ONE TOUCH ULTRA TEST test strip  03/07/2016: Received from: External Pharmacy  . rosuvastatin (CRESTOR) 5 MG tablet Take 1 tablet  by mouth daily.   . [DISCONTINUED] montelukast (SINGULAIR) 10 MG tablet Take 1 tablet (10 mg total) by mouth at bedtime.   . [DISCONTINUED] ranitidine (ZANTAC) 300 MG tablet Take 1 tablet (300 mg total) by mouth at bedtime.   . [DISCONTINUED] triamcinolone (NASACORT ALLERGY 24HR) 55 MCG/ACT AERO nasal inhaler Place 1 spray into the nose daily.    No facility-administered encounter medications on file as of 01/28/2021.    Allergies:  Allergies  Allergen Reactions  . Codeine Other (See Comments)    dizzy  . Tetanus Toxoid   . Vytorin [Ezetimibe-Simvastatin] Other (See Comments)     UNSPECIFIED    Family History: Family History  Problem Relation Age of Onset  . Diabetes Mother   . Diabetes Sister   . Heart disease Father   . Colon cancer Neg Hx   . Stomach cancer Neg Hx   . Breast cancer Neg Hx     Social History: Social History   Tobacco Use  . Smoking status: Former Research scientist (life sciences)  . Smokeless tobacco: Never Used  Vaping Use  . Vaping Use: Never used  Substance Use Topics  . Alcohol use: No    Alcohol/week: 0.0 standard drinks  . Drug use: No   Social History   Social History Narrative   Right handed    Lives with husband    Vital Signs:  BP 132/82   Pulse 79   Ht 5\' 3"  (1.6 m)   Wt 159 lb 9.6 oz (72.4 kg)   SpO2 98%   BMI 28.27 kg/m   Neurological Exam: MENTAL STATUS including orientation to time, place, person, recent and remote memory, attention span and concentration, language, and fund of knowledge is normal.  Speech is not dysarthric.  CRANIAL NERVES: II:  No visual field defects.    III-IV-VI: Pupils equal round and reactive to light.  Normal conjugate, extra-ocular eye movements in all directions of gaze.  No nystagmus.  No ptosis.   V:  Normal facial sensation.    VII:  Normal facial symmetry and movements.   VIII:  Normal hearing and vestibular function.   IX-X:  Normal palatal movement.   XI:  Normal shoulder shrug and head rotation.   XII:  Normal tongue strength and range of motion, no deviation or fasciculation.  MOTOR:  No atrophy, fasciculations or abnormal movements.  No pronator drift.   Upper Extremity:  Right  Left  Deltoid  5/5   5/5   Biceps  5/5   5/5   Triceps  5/5   5/5   Infraspinatus 5/5  5/5  Medial pectoralis 5/5  5/5  Wrist extensors  5/5   5/5   Wrist flexors  5/5   5/5   Finger extensors  5/5   5/5   Finger flexors  5/5   5/5   Dorsal interossei  5/5   5/5   Abductor pollicis  5/5   5/5   Tone (Ashworth scale)  0  0   Lower Extremity:  Right  Left  Hip flexors  5/5   5/5   Hip extensors  5/5    5/5   Adductor 5/5  5/5  Abductor 5/5  5/5  Knee flexors  5/5   5/5   Knee extensors  5/5   5/5   Dorsiflexors  5/5   5/5   Plantarflexors  5/5   5/5   Toe extensors  5-/5   5-/5   Toe flexors  5/5  5/5   Tone (Ashworth scale)  0  0   MSRs:  Right        Left                  brachioradialis 2+  2+  biceps 2+  2+  triceps 2+  2+  patellar 2+  2+  ankle jerk 0  0  Hoffman no  no  plantar response down  down   SENSORY:  Vibration reduced to 80% at the ankles, 20% at the great toe bilaterally.  Gradient pattern of sensory loss to temperature and pin prick distal to mid-calf bilaterally.  Romberg's sign absent.   COORDINATION/GAIT: Normal finger-to- nose-finger.  Intact rapid alternating movements bilaterally.   Gait narrow based and stable. Stressed gait intact.  Unsteady with tandem gait.    IMPRESSION: Diabetic polyneuropathy affecting the feet and possibly also involving the fingertips.  To better characterize the nature of her hand parethesias, she will have NCS/EMG to be sure other etiology such as radiculopathy or entrapment neuropathy has not been overlooked.   PLAN/RECOMMENDATIONS:  Check vitamin B12, folate, TSH, copper, SPEP with IFE NCS/EMG of the arms Optimize diabetes control with lifestyle modification and medication OK to stop gabapentin as this is not effective for negative symptoms "numbness" Patient educated on daily foot inspection, fall prevention, and safety precautions around the home.  Return to clinic in 4 months.   Thank you for allowing me to participate in patient's care.  If I can answer any additional questions, I would be pleased to do so.    Sincerely,    Dreshon Proffit K. Posey Pronto, DO

## 2021-01-28 ENCOUNTER — Other Ambulatory Visit: Payer: Self-pay

## 2021-01-28 ENCOUNTER — Ambulatory Visit: Payer: Medicare HMO | Admitting: Neurology

## 2021-01-28 ENCOUNTER — Other Ambulatory Visit: Payer: Medicare HMO

## 2021-01-28 ENCOUNTER — Encounter: Payer: Self-pay | Admitting: Neurology

## 2021-01-28 VITALS — BP 132/82 | HR 79 | Ht 63.0 in | Wt 159.6 lb

## 2021-01-28 DIAGNOSIS — E1140 Type 2 diabetes mellitus with diabetic neuropathy, unspecified: Secondary | ICD-10-CM | POA: Diagnosis not present

## 2021-01-28 DIAGNOSIS — R202 Paresthesia of skin: Secondary | ICD-10-CM

## 2021-01-28 LAB — B12 AND FOLATE PANEL
Folate: >23.6 ng/mL (ref >5.9)
Vitamin B-12: 308 pg/mL (ref 211–911)

## 2021-01-28 LAB — TSH: TSH: 2 u[IU]/mL (ref 0.35–4.50)

## 2021-01-28 NOTE — Patient Instructions (Addendum)
Check labs  Nerve testing of the arms.  Do not apply lotion or oils to your arms/hands.  Check your feet daily  Take extra care on uneven ground  Return to clinic in 4 months  [place] (EMG/NCS) INSTRUCTIONS  How to Prepare The neurologist conducting the EMG will need to know if you have certain medical conditions. Tell the neurologist and other EMG lab personnel if you: . Have a pacemaker or any other electrical medical device . Take blood-thinning medications . Have hemophilia, a blood-clotting disorder that causes prolonged bleeding Bathing Take a shower or bath shortly before your exam in order to remove oils from your skin. Don't apply lotions or creams before the exam.  What to Expect You'll likely be asked to change into a hospital gown for the procedure and lie down on an examination table. The following explanations can help you understand what will happen during the exam.  . Electrodes. The neurologist or a technician places surface electrodes at various locations on your skin depending on where you're experiencing symptoms. Or the neurologist may insert needle electrodes at different sites depending on your symptoms.  . Sensations. The electrodes will at times transmit a tiny electrical current that you may feel as a twinge or spasm. The needle electrode may cause discomfort or pain that usually ends shortly after the needle is removed. If you are concerned about discomfort or pain, you may want to talk to the neurologist about taking a short break during the exam.  . Instructions. During the needle EMG, the neurologist will assess whether there is any spontaneous electrical activity when the muscle is at rest - activity that isn't present in healthy muscle tissue - and the degree of activity when you slightly contract the muscle.  He or she will give you instructions on resting and contracting a muscle at appropriate times. Depending on what  muscles and nerves the neurologist is examining, he or she may ask you to change positions during the exam.  After your EMG You may experience some temporary, minor bruising where the needle electrode was inserted into your muscle. This bruising should fade within several days. If it persists, contact your primary care doctor.

## 2021-01-30 LAB — COPPER, SERUM: Copper: 126 ug/dL (ref 70–175)

## 2021-02-01 LAB — IMMUNOFIXATION ELECTROPHORESIS
IgG (Immunoglobin G), Serum: 640 mg/dL (ref 600–1540)
IgM, Serum: 77 mg/dL (ref 50–300)
Immunofix Electr Int: NOT DETECTED
Immunoglobulin A: 215 mg/dL (ref 70–320)

## 2021-02-01 LAB — PROTEIN ELECTROPHORESIS, SERUM
Albumin ELP: 4.1 g/dL (ref 3.8–4.8)
Alpha 1: 0.3 g/dL (ref 0.2–0.3)
Alpha 2: 0.8 g/dL (ref 0.5–0.9)
Beta 2: 0.4 g/dL (ref 0.2–0.5)
Beta Globulin: 0.5 g/dL (ref 0.4–0.6)
Gamma Globulin: 0.6 g/dL — ABNORMAL LOW (ref 0.8–1.7)
Total Protein: 6.7 g/dL (ref 6.1–8.1)

## 2021-03-10 ENCOUNTER — Other Ambulatory Visit: Payer: Self-pay

## 2021-03-10 ENCOUNTER — Ambulatory Visit: Payer: Medicare HMO | Admitting: Neurology

## 2021-03-10 DIAGNOSIS — Z1389 Encounter for screening for other disorder: Secondary | ICD-10-CM | POA: Diagnosis not present

## 2021-03-10 DIAGNOSIS — M5412 Radiculopathy, cervical region: Secondary | ICD-10-CM

## 2021-03-10 DIAGNOSIS — N183 Chronic kidney disease, stage 3 unspecified: Secondary | ICD-10-CM | POA: Diagnosis not present

## 2021-03-10 DIAGNOSIS — E1140 Type 2 diabetes mellitus with diabetic neuropathy, unspecified: Secondary | ICD-10-CM

## 2021-03-10 DIAGNOSIS — G5601 Carpal tunnel syndrome, right upper limb: Secondary | ICD-10-CM

## 2021-03-10 DIAGNOSIS — Z0000 Encounter for general adult medical examination without abnormal findings: Secondary | ICD-10-CM | POA: Diagnosis not present

## 2021-03-10 DIAGNOSIS — G629 Polyneuropathy, unspecified: Secondary | ICD-10-CM | POA: Diagnosis not present

## 2021-03-10 DIAGNOSIS — M1990 Unspecified osteoarthritis, unspecified site: Secondary | ICD-10-CM | POA: Diagnosis not present

## 2021-03-10 DIAGNOSIS — K589 Irritable bowel syndrome without diarrhea: Secondary | ICD-10-CM | POA: Diagnosis not present

## 2021-03-10 DIAGNOSIS — J309 Allergic rhinitis, unspecified: Secondary | ICD-10-CM | POA: Diagnosis not present

## 2021-03-10 DIAGNOSIS — K219 Gastro-esophageal reflux disease without esophagitis: Secondary | ICD-10-CM | POA: Diagnosis not present

## 2021-03-10 DIAGNOSIS — E1142 Type 2 diabetes mellitus with diabetic polyneuropathy: Secondary | ICD-10-CM | POA: Diagnosis not present

## 2021-03-10 DIAGNOSIS — I129 Hypertensive chronic kidney disease with stage 1 through stage 4 chronic kidney disease, or unspecified chronic kidney disease: Secondary | ICD-10-CM | POA: Diagnosis not present

## 2021-03-10 DIAGNOSIS — E785 Hyperlipidemia, unspecified: Secondary | ICD-10-CM | POA: Diagnosis not present

## 2021-03-10 NOTE — Procedures (Signed)
[facility]  [place]  [place], [place] [address] Tel: [phone] Fax:  [phone] Test Date:  03/10/2021  Patient: Rebecca Harmon DOB: 1945/12/04 Physician: Narda Amber, DO  Sex: Female Height: 5\' 3"  Ref Phys: Narda Amber, DO  ID#: [id number]   Technician:    Patient Complaints: This is a 75 year old female referred for evaluation of bilateral hand paresthesias.  NCV & EMG Findings: Extensive electrodiagnostic testing of the right upper extremity and additional studies of the left shows:  1. Right median sensory response shows latency (4.1 ms) and reduced amplitude (8.9 V).  Left median, left mixed palmar, bilateral ulnar, and bilateral radial sensory responses are within normal limits. 2. Right median motor response shows prolonged latency (4.5 ms).  Left median and bilateral ulnar motor responses are within normal limits.   3. 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     Impression: 1. Right median neuropathy at or distal to the wrist (moderate), consistent with a clinical diagnosis of carpal tunnel syndrome.   2. Chronic C6-7 radiculopathy affecting bilateral upper extremities, mild-to-moderate.   ___________________________ Narda Amber, DO    Nerve Conduction Studies Anti Sensory Summary Table   Stim Site NR Peak (ms) Norm Peak (ms) P-T Amp (V) Norm P-T Amp  Left Median Anti Sensory (2nd Digit)  Wrist    3.8 <3.8 15.2 >10  Right Median Anti Sensory (2nd Digit)  Wrist    4.1 <3.8 8.9 >10  Left Radial Anti Sensory (Base 1st Digit)  Wrist    2.6 <2.8 15.7 >10  Right Radial Anti Sensory (Base 1st Digit)  Wrist    2.5 <2.8 13.2 >10  Left Ulnar Anti Sensory (5th Digit)  Wrist    2.9 <3.2 10.6 >5  Right Ulnar Anti Sensory (5th Digit)  Wrist    2.7 <3.2 13.5 >5   Motor Summary Table   Stim Site NR Onset (ms) Norm Onset (ms) O-P Amp (mV) Norm O-P Amp  Site1 Site2 Delta-0 (ms) Dist (cm) Vel (m/s) Norm Vel (m/s)  Left Median Motor (Abd Poll Brev)  Wrist    3.5 <4.0 5.2 >5 Elbow Wrist 5.0 28.0 56 >50  Elbow    8.5  5.2         Right Median Motor (Abd Poll Brev)  Wrist    4.5 <4.0 5.2 >5 Elbow Wrist 4.8 26.0 54 >50  Elbow    9.3  4.6         Left Ulnar Motor (Abd Dig Minimi)  Wrist    2.2 <3.1 12.0 >7 B Elbow Wrist 3.5 19.0 54 >50  B Elbow    5.7  10.8  A Elbow B Elbow 2.0 10.0 50 >50  A Elbow    7.7  10.6         Right Ulnar Motor (Abd Dig Minimi)  Wrist    2.7 <3.1 10.4 >7 B Elbow Wrist 3.3 20.0 61 >50  B Elbow    6.0  9.8  A Elbow B Elbow 1.9 10.0 53 >50  A Elbow    7.9  9.5          Comparison Summary Table   Stim Site NR Peak (ms) Norm Peak (ms) P-T Amp (V) Site1 Site2 Delta-P (ms) Norm Delta (ms)  Left Median/Ulnar Palm Comparison (Wrist - 8cm)  Median Palm    1.9 <2.2 33.5 Median Palm Ulnar Palm 0.3   Ulnar  Palm    1.6 <2.2 8.9       EMG   Side Muscle Ins Act Fibs Psw Fasc Number Recrt Dur Dur. Amp Amp. Poly Poly. Comment  Right 1stDorInt Nml Nml Nml Nml Nml Nml Nml Nml Nml Nml Nml Nml N/A  Right Abd Poll Brev Nml Nml Nml Nml 1- Rapid Some 1+ Some 1+ Nml Nml N/A  Right PronatorTeres Nml Nml Nml Nml 1- Rapid Some 1+ Some 1+ Some 1+ N/A  Right Biceps Nml Nml Nml Nml 1- Rapid Some 1+ Some 1+ Nml Nml N/A  Right Triceps Nml Nml Nml Nml 1- Rapid Some 1+ Some 1+ Nml Nml N/A  Right Deltoid Nml Nml Nml Nml Nml Nml Nml Nml Nml Nml Nml Nml N/A  Left 1stDorInt Nml Nml Nml Nml Nml Nml Nml Nml Nml Nml Nml Nml N/A  Left PronatorTeres Nml Nml Nml Nml 1- Rapid Some 1+ Some 1+ Nml Nml N/A  Left Ext Indicis Nml Nml Nml Nml Nml Nml Nml Nml Nml Nml Nml Nml N/A  Left Biceps Nml Nml Nml Nml 1- Rapid Some 1+ Some 1+ Nml Nml N/A  Left Triceps Nml Nml Nml Nml 1- Rapid Some 1+ Some 1+ Nml Nml N/A  Left Deltoid Nml Nml Nml Nml Nml Nml Nml Nml Nml Nml Nml Nml N/A      Waveforms:

## 2021-03-11 ENCOUNTER — Other Ambulatory Visit: Payer: Self-pay

## 2021-03-11 ENCOUNTER — Telehealth: Payer: Self-pay | Admitting: Neurology

## 2021-03-11 DIAGNOSIS — M5412 Radiculopathy, cervical region: Secondary | ICD-10-CM

## 2021-03-11 DIAGNOSIS — G5601 Carpal tunnel syndrome, right upper limb: Secondary | ICD-10-CM

## 2021-03-11 NOTE — Telephone Encounter (Signed)
Called and notified patient that her EMG shows right CTS and C6-7 radiculopathy affecting both hands.  Recommend that she wear wrist brace at nighttime and start neck PT.  Pt in agreement.

## 2021-04-02 ENCOUNTER — Other Ambulatory Visit: Payer: Self-pay

## 2021-04-02 ENCOUNTER — Ambulatory Visit: Payer: Medicare HMO | Attending: Neurology

## 2021-04-02 DIAGNOSIS — M542 Cervicalgia: Secondary | ICD-10-CM

## 2021-04-02 DIAGNOSIS — G8929 Other chronic pain: Secondary | ICD-10-CM | POA: Insufficient documentation

## 2021-04-02 DIAGNOSIS — M5412 Radiculopathy, cervical region: Secondary | ICD-10-CM | POA: Insufficient documentation

## 2021-04-02 DIAGNOSIS — G5603 Carpal tunnel syndrome, bilateral upper limbs: Secondary | ICD-10-CM | POA: Insufficient documentation

## 2021-04-02 DIAGNOSIS — M25512 Pain in left shoulder: Secondary | ICD-10-CM | POA: Diagnosis not present

## 2021-04-02 DIAGNOSIS — M65342 Trigger finger, left ring finger: Secondary | ICD-10-CM

## 2021-04-02 NOTE — Therapy (Signed)
OUTPATIENT PHYSICAL THERAPY NEURO EVALUATION   Patient Name: Rebecca Harmon MRN: [id number] DOB:[birth year], 75 y.o., female Today's Date: 04/02/2021  PCP: Kelton Pillar, MD REFERRING PROVIDER: Alda Berthold, DO   PT End of Session - 04/02/21 1222    Visit Number 1    Number of Visits 13    Date for PT Re-Evaluation 05/14/21    Authorization Type [employer]    Authorization Time Period Cert from 1/7-0/01    Progress Note Due on Visit 10    PT Start Time 1015    PT Stop Time 1100    PT Time Calculation (min) 45 min    Activity Tolerance Patient tolerated treatment well    Behavior During Therapy [facility] for tasks assessed/performed           Past Medical History:  Diagnosis Date  . Allergy    SEASONAL  . Chest pain 2-13 2012   echo EF 50-55% read by Dr. Claiborne Billings pt. of Dr. Ellyn Hack  . Colon polyp    adenomatous  . Diabetes mellitus without complication ([place])   . Dizziness 03-06-2013   stress test Read by Dr. Gwenlyn Found, normal stress test  . Fatty liver   . GERD (gastroesophageal reflux disease)   . Hyperlipidemia   . Hypertension    Past Surgical History:  Procedure Laterality Date  . ABDOMINAL HYSTERECTOMY    . APPENDECTOMY    . BREAST EXCISIONAL BIOPSY Left   . CHOLECYSTECTOMY    . COLONOSCOPY    . THYROIDECTOMY, PARTIAL    . TONSILLECTOMY     x2   Patient Active Problem List   Diagnosis Date Noted  . Change in bowel habits 09/01/2015  . Gastroesophageal reflux disease without esophagitis 09/01/2015  . Chronic abdominal pain 09/01/2015  . Morbid obesity ([name]) 06/12/2015  . CHANGE IN BOWELS 09/30/2009  . ABDOMINAL PAIN-EPIGASTRIC 09/30/2009  . ABDOMINAL PAIN -GENERALIZED 09/30/2009  . DIABETES MELLITUS 01/15/2008  . DYSLIPIDEMIA 01/15/2008  . HYPERTENSION 01/15/2008    Onset date: 3/33/22+  REFERRING DIAG: M54.12 (ICD-10-CM) - Cervical radiculopathy    THERAPY DIAG:  Neck pain - Plan: PT plan of care cert/re-cert  Radiculopathy, cervical region  - Plan: PT plan of care cert/re-cert  Bilateral carpal tunnel syndrome - Plan: PT plan of care cert/re-cert  Chronic left shoulder pain - Plan: PT plan of care cert/re-cert  Trigger ring finger of left hand - Plan: PT plan of care cert/re-cert  SUBJECTIVE: Starting around 2017, she began having tingling and numbness of the toes which has slowly extended into the entire feet and lower legs. She has predominately numbness and tight sensation, as if a ruberband was squeezing. It bothers her more when she is resting and in the evening. She has imbalance, does not use a cane and has not had any falls. Pt reports ring finger in left hand gets stuck in morning and she has to force it open and it pos. Pt reports that she cannot carry pans in her left hand as she drops them.  PAIN:  Are you having pain? Yes VAS scale: 4/10 Pain location: L shoulder Pain orientation: Left  PAIN TYPE: aching Pain description: intermittent  Aggravating factors: Reaching overhead, lifting, carrying Relieving factors: rest, reposition  PRECAUTIONS: None  WEIGHT BEARING RESTRICTIONS No  FALLS: Has patient fallen in last 6 months? No  LIVING ENVIRONMENT: PLOF: Independent  PATIENT GOALS improve pain in L shoulder, improve strength in L hand  OBJECTIVE:   DIAGNOSTIC FINDINGS: n/a  COGNITION: Overall cognitive status: Within functional limits for tasks assessed  CERVICAL AROM/PROM A/PROM A/PROM (deg) @td @  Flexion 42  Extension 57  Right lateral flexion 40  Left lateral flexion 30  Right rotation 54  Left rotation 65   UE AROM/PROM: WFL bil UE   Right Left      Shoulder flexion 5/5 3+/5  Shoulder abduction 5/5 3+/5  Shoulder extension 5/5 5/5  Shoulder internal  rotation 5/5 5/5  Shoulder external   rotation 5/5 5/5  Elbow flexion 5/5 5/5  Elbow extension 5/5 5/5  Wrist flexion 5/5 5/5  Wrist extension 5/5 5/5   Grip strength: Right = 55lbs; Left = 32 lbs  Palpation: - tender over rotator cuff and biceps tendon in L shoulder compared to Right - tender over tendon of flexor digitorum of ring finger.  Special tests: - Neer impingement test: pos on L Michel Bickers test: pos on L   TODAY'S TREATMENT:  Wall slides into scaption: L only 3 x 10 Pt education:   Pt educated on pathology and treatment options for carpal tunnel syndrome, trigger finger, shoulder tendonitis, cervical radiculopathy  Pt educated on warm water soaks for L hand and then massaging ring finger tendon for 5 min lightly  Pt given resources for trigger finger splint and gave printed resouces so she can wear that at night   Discussed evaluation findings with patient and POC.   PATIENT EDUCATION: Education details: See above Person educated: Patient Education method: Explanation Education comprehension: verbalized understanding   HOME EXERCISE PROGRAM: Access Code [email or web address]  ASSESSMENT:  CLINICAL IMPRESSION: Patient is a 75 y.o. [place] who was seen today for physical therapy evaluation and treatent for cervical radiculopathy bil, neck pain, L shoulder pain, carpal tunnel syndrome and L trigger finger in digit 3. Objective impairments include decreased knowledge of condition, decreased ROM, decreased strength, hypomobility, increased fascial restrictions, impaired flexibility, impaired sensation, impaired UE functional use, postural dysfunction and pain. These impairments are limiting patient from cleaning, driving, laundry, yard work and reaching [employer], lifting, carrying, pushing, pulling. Personal factors including Past/current experiences, Time since onset of injury/illness/exacerbation and 1-2 comorbidities: DM, HTN are also affecting patient's functional outcome. Patient will benefit from skilled PT to  address above impairments and improve overall function.  REHAB POTENTIAL: Good  CLINICAL DECISION MAKING: Stable/uncomplicated  EVALUATION COMPLEXITY: Low   GOALS: Goals reviewed with patient? Yes  SHORT TERM GOALS:  STG Name Target Date Goal status  1 Pt will demo 50 deg of cervical flexion to improve flexibility when reading book/newspaper Comments: 42 deg flexion (04/02/21) 05/24/21 INITIAL  2 Pt will report 50% improvement in L shoulder pain with daily activities to improve function. Comments: 4-5/10 at worst in L shoulder intermittent (04/02/21) 05/24/21 INITIAL  3 Pt will get a trigger finger splint to help manage pain in L ring finger. Comments:  05/24/21 INITIAL   LONG TERM GOALS:   LTG Name Target Date Goal status  1 Pt will demo 5/5 strength with L shoulder flexion and abduction to  improve ability to lift heavy pans in [facility] cabinets Comments: 3+/5 (04/02/21) 05/14/21 INITIAL  2 Pt will report no shoulder pain in L shoulder with ADLs and self care. Comments: 5/10 L shoulder (04/02/21) 05/14/21 INITIAL  3 Pt will demo at least 70 deg of cervical rotation bil to improve ability to turn head while driving. Comments: R = 54 deg; L = 65 deg (04/02/21) 05/14/21 INITIAL   PLAN: PT FREQUENCY: 1-2x/week  PT DURATION: 6 weeks  PLANNED INTERVENTIONS: Therapeutic exercises, Therapeutic activity, Neuro Muscular re-education, Balance training, Gait training, Patient/Family education, Joint mobilization, Dry Needling, Spinal mobilization, Cryotherapy, Moist heat, Ultrasound and Manual therapy  PLAN FOR NEXT SESSION: Cervical mobilization, shoulder deep friction massage over RTC tendons and proximal biceps tendon, issue cervical flexibility (movement with mobilization for rotation)   Kerrie Pleasure, PT 04/02/2021, 12:23 PM  [facility] [place] [address] [place], [place], [address] Phone: [phone]   Fax:  [phone]

## 2021-04-05 NOTE — Progress Notes (Signed)
Cardiology Office Note:    Date:  04/08/2021   ID:  KORINNE Harmon, DOB June 08, 1946, MRN [id number]  PCP:  Kelton Pillar, MD   [facility] Providers Cardiologist:  None {   Referring MD: Kelton Pillar, MD    History of Present Illness:    Rebecca Harmon is a 75 y.o. female with a hx of DMII, HTN, HLD and GERD who was referred by Dr. Laurann Montana for further evaluation of palpitations.  The patient states that she has been having intermittent palpitations that have been getting progresively worse over the past 6 months. Each episode lasts about 1-17min before abating on it's own. Nothing seems to make it better or worse. Has been taking metoprolol for years and the symptoms continue to occur despite the medication. Symptoms are not exertional related. Has woken her up at night a couple of times. TSH normal. No significant caffeine use. Admits to not drinking much fluid during the day.  Also notes intermittent chest pain that are usually sharp that lasts a couple of seconds. Symptoms are not exertional. Not pleuritic. Cannot predict when the pain is going to occur and they go away without intervention. Overall, the patient is active and walks daily from 0.5-11miles without chest pain, SOB, lightheadedness, dizziness. No LE edema, PND, or orthopnea.     Past Medical History:  Diagnosis Date  . Allergy    SEASONAL  . Chest pain 2-13 2012   echo EF 50-55% read by Dr. Claiborne Billings pt. of Dr. Ellyn Hack  . Colon polyp    adenomatous  . Diabetes mellitus without complication ([place])   . Dizziness 03-06-2013   stress test Read by Dr. Gwenlyn Found, normal stress test  . Fatty liver   . GERD (gastroesophageal reflux disease)   . Hyperlipidemia   . Hypertension     Past Surgical History:  Procedure Laterality Date  . ABDOMINAL HYSTERECTOMY    . APPENDECTOMY    . BREAST EXCISIONAL BIOPSY Left   . CHOLECYSTECTOMY    . COLONOSCOPY    . THYROIDECTOMY, PARTIAL    . TONSILLECTOMY     x2     Current Medications: Current Meds  Medication Sig  . aspirin EC 81 MG tablet Take 81 mg by mouth daily.  Marland Kitchen gabapentin (NEURONTIN) 100 MG capsule Take 100 mg by mouth as needed.  . hydrochlorothiazide (HYDRODIURIL) 25 MG tablet Take 25 mg by mouth daily.  Marland Kitchen losartan (COZAAR) 50 MG tablet Take 1 tablet by mouth daily.  . metFORMIN (GLUCOPHAGE) 1000 MG tablet Take 2,000 mg by mouth daily.  . metoprolol succinate (TOPROL-XL) 100 MG 24 hr tablet Take 100 mg by mouth daily.  . Multiple Vitamins-Minerals (CENTRUM SILVER 50+WOMEN PO) Take by mouth daily.  Marland Kitchen omeprazole (PRILOSEC) 40 MG capsule Take 40 mg by mouth every morning.   . ONE TOUCH ULTRA TEST test strip   . rosuvastatin (CRESTOR) 5 MG tablet Take 1 tablet by mouth daily.     Allergies:   Codeine, Tetanus toxoid, and Vytorin [ezetimibe-simvastatin]   Social History   Socioeconomic History  . Marital status: Married    Spouse name: Not on file  . Number of children: 2  . Years of education: Not on file  . Highest education level: Not on file  Occupational History  . Occupation: Retired  Tobacco Use  . Smoking status: Former Research scientist (life sciences)  . Smokeless tobacco: Never Used  Vaping Use  . Vaping Use: Never used  Substance and Sexual Activity  . Alcohol  use: No    Alcohol/week: 0.0 standard drinks  . Drug use: No  . Sexual activity: Yes    Partners: Male    Birth control/protection: None  Other Topics Concern  . Not on file  Social History Narrative   Right handed    Lives with husband   She has two grown healthy sons.    Retired Armed forces operational officer.    Social Determinants of Health   Financial Resource Strain: Not on file  Food Insecurity: Not on file  Transportation Needs: Not on file  Physical Activity: Not on file  Stress: Not on file  Social Connections: Not on file     Family History: The patient's family history includes Diabetes in her mother and sister; Heart disease in her father. There is no history of Colon  cancer, Stomach cancer, or Breast cancer.  ROS:   Please see the history of present illness.    Review of Systems  Constitutional: Negative for chills and fever.  HENT: Negative for hearing loss.   Eyes: Negative for blurred vision and redness.  Respiratory: Negative for shortness of breath.   Cardiovascular: Positive for chest pain and palpitations. Negative for orthopnea, claudication, leg swelling and PND.  Gastrointestinal: Negative for melena, nausea and vomiting.  Genitourinary: Negative for hematuria.  Musculoskeletal: Negative for falls.  Neurological: Negative for dizziness and loss of consciousness.  Endo/Heme/Allergies: Negative for polydipsia.  Psychiatric/Behavioral: Negative for substance abuse.    EKGs/Labs/Other Studies Reviewed:    The following studies were reviewed today: Myoview 2014:  Impression Exercise Capacity:  Good exercise capacity. BP Response:  Normal blood pressure response. Clinical Symptoms:  No significant symptoms noted. ECG Impression:  No significant ST segment change suggestive of ischemia. Comparison with Prior Nuclear Study: No significant change from previous study  Overall Impression:  Normal stress nuclear study.  LV Wall Motion:  NL LV Function; NL Wall Motion   EKG:  EKG is  ordered today.  The ekg ordered today demonstrates NSR with HR 83  Recent Labs: 01/28/2021: TSH 2.00  Recent Lipid Panel No results found for: CHOL, TRIG, HDL, CHOLHDL, VLDL, LDLCALC, LDLDIRECT   Risk Assessment/Calculations:       Physical Exam:    VS:  BP 136/90   Pulse 83   Ht 5\' 3"  (1.6 m)   Wt 160 lb 9.6 oz (72.8 kg)   SpO2 98%   BMI 28.45 kg/m     Wt Readings from Last 3 Encounters:  04/08/21 160 lb 9.6 oz (72.8 kg)  01/28/21 159 lb 9.6 oz (72.4 kg)  07/04/17 162 lb 9.6 oz (73.8 kg)     GEN:  Well nourished, well developed in no acute distress HEENT: Normal NECK: No JVD; No carotid bruits CARDIAC: RRR, no murmurs, rubs,  gallops RESPIRATORY:  Clear to auscultation without rales, wheezing or rhonchi  ABDOMEN: Soft, non-tender, non-distended MUSCULOSKELETAL:  No edema; No deformity  SKIN: Warm and dry NEUROLOGIC:  Alert and oriented x 3 PSYCHIATRIC:  Normal affect   ASSESSMENT:    1. Palpitations   2. Hyperlipidemia, unspecified hyperlipidemia type   3. Primary hypertension   4. Type 2 diabetes mellitus without complication, without long-term current use of insulin (HCC)    PLAN:    In order of problems listed above:  #Palpitations: Patient has been having intermittent palpitations over the past 6 months. Each episode lasts 1-26min. No associated lightheadedness, dizziness or syncope. Symptoms are not exertional. Cannot predict when they are going to occur. No  known history of CAD or arrhythmias.  -Check 2 week zio -Continue metop succinate 100mg  XL -TSH normal -Increase fluid intake -No significant caffeine intake  #HTN: Controlled at home. -Continue HCTZ 25mg  daily -Continue losartan 50mg  daily -Continue metop succinate 100mg  XL  #HLD: Well controlled currently LDL 74.  -Check coronary calcium score -Continue crestor 5mg  daily  #DMII: -Continue metformin 2000mg  daily      Medication Adjustments/Labs and Tests Ordered: Current medicines are reviewed at length with the patient today.  Concerns regarding medicines are outlined above.  Orders Placed This Encounter  Procedures  . CT CARDIAC SCORING (SELF PAY ONLY)  . LONG TERM MONITOR (3-14 DAYS)  . EKG 12-Lead   No orders of the defined types were placed in this encounter.   Patient Instructions  Medication Instructions:   Your physician recommends that you continue on your current medications as directed. Please refer to the Current Medication list given to you today.  *If you need a refill on your cardiac medications before your next appointment, please call your pharmacy*   Testing/Procedures:  CARDIAC CALCIUM SCORE TO [place] OFFICE--SELF PAY   ZIO XT- Long Term Monitor Instructions   Your physician has requested you wear a ZIO patch monitor for 14___ days.  This is a single patch monitor.   [employer] supplies one patch monitor per enrollment. Additional stickers are not available. Please do not apply patch if you will be having a Nuclear Stress Test, Echocardiogram, Cardiac CT, MRI, or Chest Xray during the period you would be wearing the monitor. The patch cannot be worn during these tests. You cannot remove and re-apply the ZIO XT patch monitor.  Your ZIO patch monitor will be sent Fed Ex from [employer] directly to your home address. It may take 3-5 days to receive your monitor after you have been enrolled.  Once you have received your monitor, please review the enclosed instructions. Your monitor has already been registered assigning a specific monitor serial # to you.  Billing and Patient Assistance Program Information   We have supplied [employer] with any of your insurance information on file for billing purposes. [employer] offers a sliding scale Patient Assistance Program for patients that do not have insurance, or whose insurance does not completely cover the cost of the ZIO monitor.   You must apply for the Patient Assistance Program to qualify for this discounted rate.     To apply, please call [employer] at [phone], select option 4, then select option 2, and ask to apply for Patient Assistance Program.  Theodore Demark will ask your household income, and how many people are in your household.  They will quote your out-of-pocket cost based on that information.  [employer] will also be able to set up a 71-month, interest-free payment plan if needed.  Applying the monitor   Shave hair from upper left chest.  Hold abrader disc by orange tab. Rub abrader in 40 strokes over the upper left chest as indicated in your monitor instructions.  Clean area with 4 enclosed alcohol pads. Let dry.  Apply patch  as indicated in monitor instructions. Patch will be placed under collarbone on left side of chest with arrow pointing upward.  Rub patch adhesive wings for 2 minutes. Remove white label marked "1". Remove the white label marked "2". Rub patch adhesive wings for 2 additional minutes.  While looking in a mirror, press and release button in center of patch. A small green light will flash 3-4  times. This will be your only indicator that the monitor has been turned on. ?  Do not shower for the first 24 hours. You may shower after the first 24 hours.  Press the button if you feel a symptom. You will hear a small click. Record Date, Time and Symptom in the Patient Logbook.  When you are ready to remove the patch, follow instructions on the last 2 pages of the Patient Logbook. Stick patch monitor onto the last page of Patient Logbook.  Place Patient Logbook in the blue and white box.  Use locking tab on box and tape box closed securely.  The blue and white box has prepaid postage on it. Please place it in the mailbox as soon as possible. Your physician should have your test results approximately 7 days after the monitor has been mailed back to [facility]  Call [place] at [phone] if you have questions regarding your ZIO XT patch monitor. Call them immediately if you see an orange light blinking on your monitor.  If your monitor falls off in less than 4 days, contact our Monitor department at [phone]. ?If your monitor becomes loose or falls off after 4 days call [employer] at [phone] for suggestions on securing your monitor.?    Follow-Up: At [facility], you and your health needs are our priority.  As part of our continuing mission to provide you with exceptional heart care, we have created designated Provider Care Teams.  These Care Teams include your primary Cardiologist (physician) and Advanced Practice Providers (APPs -  Physician Assistants and Nurse  Practitioners) who all work together to provide you with the care you need, when you need it.  We recommend signing up for the patient portal called "MyChart".  Sign up information is provided on this After Visit Summary.  MyChart is used to connect with patients for Virtual Visits (Telemedicine).  Patients are able to view lab/test results, encounter notes, upcoming appointments, etc.  Non-urgent messages can be sent to your provider as well.   To learn more about what you can do with MyChart, go to [email or web address].    Your next appointment:   6 month(s)  The format for your next appointment:   In Person  Provider:   You will see one of the following Advanced Practice Providers on your designated Care Team:    Richardson Dopp, PA-C  [place], [place]  DAYNA DUNN PA-C  Cecilie Kicks NP  [facility] PA-C  JILL MCDANIEL NP       Signed, Freada Bergeron, MD  04/08/2021 10:49 AM    [place]

## 2021-04-07 ENCOUNTER — Other Ambulatory Visit: Payer: Self-pay

## 2021-04-07 ENCOUNTER — Ambulatory Visit: Payer: Medicare HMO

## 2021-04-07 DIAGNOSIS — M542 Cervicalgia: Secondary | ICD-10-CM

## 2021-04-07 DIAGNOSIS — M5412 Radiculopathy, cervical region: Secondary | ICD-10-CM

## 2021-04-07 DIAGNOSIS — M25512 Pain in left shoulder: Secondary | ICD-10-CM

## 2021-04-07 DIAGNOSIS — G8929 Other chronic pain: Secondary | ICD-10-CM

## 2021-04-07 DIAGNOSIS — M65342 Trigger finger, left ring finger: Secondary | ICD-10-CM | POA: Diagnosis not present

## 2021-04-07 DIAGNOSIS — G5603 Carpal tunnel syndrome, bilateral upper limbs: Secondary | ICD-10-CM | POA: Diagnosis not present

## 2021-04-07 NOTE — Therapy (Signed)
OUTPATIENT PHYSICAL THERAPY TREATMENT NOTE   Patient Name: Rebecca Harmon MRN: [id number] DOB:[birth year], 75 y.o., female Today's Date: 04/07/2021  PCP: Kelton Pillar, MD REFERRING PROVIDER: Alda Berthold, DO   PT End of Session - 04/07/21 1110    Visit Number 2    Number of Visits 13    Date for PT Re-Evaluation 05/14/21    Authorization Type [employer] Medicare    Authorization Time Period Cert from 8/9-2/11    Progress Note Due on Visit 10    PT Start Time 1105    PT Stop Time 1145    PT Time Calculation (min) 40 min    Activity Tolerance Patient tolerated treatment well    Behavior During Therapy [facility] for tasks assessed/performed           Past Medical History:  Diagnosis Date  . Allergy    SEASONAL  . Chest pain 2-13 2012   echo EF 50-55% read by Dr. Claiborne Billings pt. of Dr. Ellyn Hack  . Colon polyp    adenomatous  . Diabetes mellitus without complication ([place])   . Dizziness 03-06-2013   stress test Read by Dr. Gwenlyn Found, normal stress test  . Fatty liver   . GERD (gastroesophageal reflux disease)   . Hyperlipidemia   . Hypertension    Past Surgical History:  Procedure Laterality Date  . ABDOMINAL HYSTERECTOMY    . APPENDECTOMY    . BREAST EXCISIONAL BIOPSY Left   . CHOLECYSTECTOMY    . COLONOSCOPY    . THYROIDECTOMY, PARTIAL    . TONSILLECTOMY     x2   Patient Active Problem List   Diagnosis Date Noted  . Change in bowel habits 09/01/2015  . Gastroesophageal reflux disease without esophagitis 09/01/2015  . Chronic abdominal pain 09/01/2015  . Morbid obesity ([place]) 06/12/2015  . CHANGE IN BOWELS 09/30/2009  . ABDOMINAL PAIN-EPIGASTRIC 09/30/2009  . ABDOMINAL PAIN -GENERALIZED 09/30/2009  . DIABETES MELLITUS 01/15/2008  . DYSLIPIDEMIA 01/15/2008  . HYPERTENSION 01/15/2008    REFERRING DIAG: M54.12 (ICD-10-CM) - Cervical radiculopathy     THERAPY DIAG:  Neck pain  Radiculopathy, cervical region  Bilateral carpal tunnel syndrome  Chronic left shoulder  pain  Trigger ring finger of left hand  SUBJECTIVE: Symptoms are same.  PAIN:  Are you having pain? Yes VAS scale: 2/10 Pain location: L Shoulder Pain orientation: Left  PAIN TYPE: aching Pain description: intermittent  Aggravating factors: reaching [place] Relieving factors: rest    OBJECTIVE:  CERVICAL AROM/PROM A/PROM A/PROM (deg) 04/02/21 AROM 04/07/21  Flexion 42 65  Extension 57 57  Right lateral flexion 40 40  Left lateral flexion 30 40  Right rotation 54 70  Left rotation 65 70    Grip strength: 50 lbs R; 20 lbs L (04/07/21)- pre manual Grip strength: 50lbs R, 35lbs L(03/28/21)- post manual  TODAY'S TREATMENT: 04/07/21 Manual therapy: Deep friction massage to rotator cuff tendons and proximal biceps tendon on L Soft tissue mobilization to supraspinatus muscle, levator scapulae and upper trap Grade IV L 1st rib mobilization Movement with mobilization: C6-C6 PA mobilization with cervical retracation and C1-2 PA mobilization with cervical retractions  TherEx: Seated cervical flexion: 3 x 10 Seated scapular retractions: 2 x 10  PATIENT EDUCATION: Education details: See above Person educated: Patient Education method: Explanation Education comprehension: verbalized understanding     HOME EXERCISE PROGRAM: Access Code [email or web address] Added scapular retractions (04/07/21)- verbally   ASSESSMENT:   CLINICAL IMPRESSION: Pt demonstrated significant improvement in cervical ROM post manual therapy.  Pt demonstrated signficiant improvement in L grip strength post manual therapy (at lower cervical spine)   REHAB POTENTIAL: Good   CLINICAL DECISION MAKING: Stable/uncomplicated   EVALUATION COMPLEXITY: Low     GOALS: Goals reviewed with patient? Yes   SHORT TERM GOALS:   STG Name Target Date Goal status  1 Pt will demo 50 deg of cervical flexion to improve flexibility when reading book/newspaper Comments: 42 deg flexion (04/02/21) 05/24/21 INITIAL  2 Pt will report 50%  improvement in L shoulder pain with daily activities to improve function. Comments: 4-5/10 at worst in L shoulder intermittent (04/02/21) 05/24/21 INITIAL  3 Pt will get a trigger finger splint to help manage pain in L ring finger. Comments:  05/24/21 INITIAL    LONG TERM GOALS:    LTG Name Target Date Goal status  1 Pt will demo 5/5 strength with L shoulder flexion and abduction to improve ability to lift heavy pans in [place] cabinets Comments: 3+/5 (04/02/21) 05/14/21 INITIAL  2 Pt will report no shoulder pain in L shoulder with ADLs and self care. Comments: 5/10 L shoulder (04/02/21) 05/14/21 INITIAL  3 Pt will demo at least 70 deg of cervical rotation bil to improve ability to turn head while driving. Comments: R = 54 deg; L = 65 deg (04/02/21) 05/14/21 INITIAL    PLAN: PT FREQUENCY: 1-2x/week   PT DURATION: 6 weeks   PLANNED INTERVENTIONS: Therapeutic exercises, Therapeutic activity, Neuro Muscular re-education, Balance training, Gait training, Patient/Family education, Joint mobilization, Dry Needling, Spinal mobilization, Cryotherapy, Moist heat, Ultrasound and Manual therapy   PLAN FOR NEXT SESSION: Cervical mobilization, shoulder deep friction massage over RTC tendons and proximal biceps tendon, issue cervical flexibility (movement with mobilization for rotation)        Kerrie Pleasure, PT 04/07/2021, 11:11 AM    [facility] [address] [place], [place], [address] Phone: [phone]   Fax:  [phone]  Patient name: Rebecca Harmon MRN: [id number] DOB: 02-28-1946

## 2021-04-08 ENCOUNTER — Telehealth: Payer: Self-pay | Admitting: *Deleted

## 2021-04-08 ENCOUNTER — Ambulatory Visit: Payer: Medicare HMO | Admitting: Cardiology

## 2021-04-08 ENCOUNTER — Encounter: Payer: Self-pay | Admitting: Cardiology

## 2021-04-08 ENCOUNTER — Ambulatory Visit (INDEPENDENT_AMBULATORY_CARE_PROVIDER_SITE_OTHER): Payer: Medicare HMO

## 2021-04-08 VITALS — BP 136/90 | HR 83 | Ht 63.0 in | Wt 160.6 lb

## 2021-04-08 DIAGNOSIS — E119 Type 2 diabetes mellitus without complications: Secondary | ICD-10-CM

## 2021-04-08 DIAGNOSIS — R002 Palpitations: Secondary | ICD-10-CM | POA: Diagnosis not present

## 2021-04-08 DIAGNOSIS — E785 Hyperlipidemia, unspecified: Secondary | ICD-10-CM | POA: Diagnosis not present

## 2021-04-08 DIAGNOSIS — I10 Essential (primary) hypertension: Secondary | ICD-10-CM

## 2021-04-08 NOTE — Patient Instructions (Signed)
Medication Instructions:   Your physician recommends that you continue on your current medications as directed. Please refer to the Current Medication list given to you today.  *If you need a refill on your cardiac medications before your next appointment, please call your pharmacy*   Testing/Procedures:  CARDIAC CALCIUM SCORE TO [place] OFFICE--SELF PAY   ZIO XT- Long Term Monitor Instructions   Your physician has requested you wear a ZIO patch monitor for 14___ days.  This is a single patch monitor.   [employer] supplies one patch monitor per enrollment. Additional stickers are not available. Please do not apply patch if you will be having a Nuclear Stress Test, Echocardiogram, Cardiac CT, MRI, or Chest Xray during the period you would be wearing the monitor. The patch cannot be worn during these tests. You cannot remove and re-apply the ZIO XT patch monitor.  Your ZIO patch monitor will be sent Fed Ex from [employer] directly to your home address. It may take 3-5 days to receive your monitor after you have been enrolled.  Once you have received your monitor, please review the enclosed instructions. Your monitor has already been registered assigning a specific monitor serial # to you.  Billing and Patient Assistance Program Information   We have supplied [employer] with any of your insurance information on file for billing purposes. [employer] offers a sliding scale Patient Assistance Program for patients that do not have insurance, or whose insurance does not completely cover the cost of the ZIO monitor.   You must apply for the Patient Assistance Program to qualify for this discounted rate.     To apply, please call [employer] at [phone], select option 4, then select option 2, and ask to apply for Patient Assistance Program.  Theodore Demark will ask your household income, and how many people are in your household.  They will quote your out-of-pocket cost based on that information.   [employer] will also be able to set up a 38-month, interest-free payment plan if needed.  Applying the monitor   Shave hair from upper left chest.  Hold abrader disc by orange tab. Rub abrader in 40 strokes over the upper left chest as indicated in your monitor instructions.  Clean area with 4 enclosed alcohol pads. Let dry.  Apply patch as indicated in monitor instructions. Patch will be placed under collarbone on left side of chest with arrow pointing upward.  Rub patch adhesive wings for 2 minutes. Remove white label marked "1". Remove the white label marked "2". Rub patch adhesive wings for 2 additional minutes.  While looking in a mirror, press and release button in center of patch. A small green light will flash 3-4 times. This will be your only indicator that the monitor has been turned on. ?  Do not shower for the first 24 hours. You may shower after the first 24 hours.  Press the button if you feel a symptom. You will hear a small click. Record Date, Time and Symptom in the Patient Logbook.  When you are ready to remove the patch, follow instructions on the last 2 pages of the Patient Logbook. Stick patch monitor onto the last page of Patient Logbook.  Place Patient Logbook in the blue and white box.  Use locking tab on box and tape box closed securely.  The blue and white box has prepaid postage on it. Please place it in the mailbox as soon as possible. Your physician should have your test results approximately 7  days after the monitor has been mailed back to [facility].  Call [place] at [phone] if you have questions regarding your ZIO XT patch monitor. Call them immediately if you see an orange light blinking on your monitor.  If your monitor falls off in less than 4 days, contact our Monitor department at [phone]. ?If your monitor becomes loose or falls off after 4 days call [employer] at [phone] for suggestions on securing your  monitor.?    Follow-Up: At [facility], you and your health needs are our priority.  As part of our continuing mission to provide you with exceptional heart care, we have created designated Provider Care Teams.  These Care Teams include your primary Cardiologist (physician) and Advanced Practice Providers (APPs -  Physician Assistants and Nurse Practitioners) who all work together to provide you with the care you need, when you need it.  We recommend signing up for the patient portal called "MyChart".  Sign up information is provided on this After Visit Summary.  MyChart is used to connect with patients for Virtual Visits (Telemedicine).  Patients are able to view lab/test results, encounter notes, upcoming appointments, etc.  Non-urgent messages can be sent to your provider as well.   To learn more about what you can do with MyChart, go to [email or web address].    Your next appointment:   6 month(s)  The format for your next appointment:   In Person  Provider:   You will see one of the following Advanced Practice Providers on your designated Care Team:    Richardson Dopp, PA-C  [place], [place]  DAYNA DUNN PA-C  Cecilie Kicks NP  [facility] LENZE PA-C  JILL [facility] NP

## 2021-04-08 NOTE — Progress Notes (Unsigned)
Patient enrolled for [employer] to ship a 14 day ZIO XT long term holter monitor to her home. 

## 2021-04-08 NOTE — Telephone Encounter (Signed)
-----   Message from Jennefer Bravo sent at 04/08/2021 11:23 AM EDT ----- Regarding: RE: 14 DAY ZIO Done ----- Message ----- From: Nuala Alpha, LPN Sent: 0/25/4270  10:21 AM EDT To: Kendall Flack Subject: 14 DAY ZIO                                     Dr. Johney Frame ordered a 14 day zio on this pt for palpitations. Order is in and pt is aware you will enroll. Can you please enroll and let me know?  Thanks, [employer]

## 2021-04-09 ENCOUNTER — Ambulatory Visit: Payer: Medicare HMO

## 2021-04-09 ENCOUNTER — Other Ambulatory Visit: Payer: Self-pay

## 2021-04-09 DIAGNOSIS — G8929 Other chronic pain: Secondary | ICD-10-CM

## 2021-04-09 DIAGNOSIS — M542 Cervicalgia: Secondary | ICD-10-CM | POA: Diagnosis not present

## 2021-04-09 DIAGNOSIS — G5603 Carpal tunnel syndrome, bilateral upper limbs: Secondary | ICD-10-CM

## 2021-04-09 DIAGNOSIS — M5412 Radiculopathy, cervical region: Secondary | ICD-10-CM

## 2021-04-09 DIAGNOSIS — M25512 Pain in left shoulder: Secondary | ICD-10-CM | POA: Diagnosis not present

## 2021-04-09 DIAGNOSIS — M65342 Trigger finger, left ring finger: Secondary | ICD-10-CM

## 2021-04-09 NOTE — Therapy (Signed)
OUTPATIENT PHYSICAL THERAPY TREATMENT NOTE   Patient Name: Rebecca Harmon MRN: [id number] DOB:05/10/1946, 75 y.o., female Today's Date: 04/09/2021  PCP: Kelton Pillar, MD REFERRING PROVIDER: Alda Berthold, DO   PT End of Session - 04/09/21 1236    Visit Number 3    Number of Visits 13    Date for PT Re-Evaluation 05/14/21    Authorization Type [employer]    Authorization Time Period Cert from 3/6-6/44    Progress Note Due on Visit 10    PT Start Time 1230    PT Stop Time 1315    PT Time Calculation (min) 45 min    Activity Tolerance Patient tolerated treatment well    Behavior During Therapy [facility] for tasks assessed/performed           Past Medical History:  Diagnosis Date  . Allergy    SEASONAL  . Chest pain 2-13 2012   echo EF 50-55% read by Dr. Claiborne Billings pt. of Dr. Ellyn Hack  . Colon polyp    adenomatous  . Diabetes mellitus without complication ([place])   . Dizziness 03-06-2013   stress test Read by Dr. Gwenlyn Found, normal stress test  . Fatty liver   . GERD (gastroesophageal reflux disease)   . Hyperlipidemia   . Hypertension    Past Surgical History:  Procedure Laterality Date  . ABDOMINAL HYSTERECTOMY    . APPENDECTOMY    . BREAST EXCISIONAL BIOPSY Left   . CHOLECYSTECTOMY    . COLONOSCOPY    . THYROIDECTOMY, PARTIAL    . TONSILLECTOMY     x2   Patient Active Problem List   Diagnosis Date Noted  . Change in bowel habits 09/01/2015  . Gastroesophageal reflux disease without esophagitis 09/01/2015  . Chronic abdominal pain 09/01/2015  . Morbid obesity ([place]) 06/12/2015  . CHANGE IN BOWELS 09/30/2009  . ABDOMINAL PAIN-EPIGASTRIC 09/30/2009  . ABDOMINAL PAIN -GENERALIZED 09/30/2009  . DIABETES MELLITUS 01/15/2008  . DYSLIPIDEMIA 01/15/2008  . HYPERTENSION 01/15/2008    REFERRING DIAG: M54.12 (ICD-10-CM) - Cervical radiculopathy     THERAPY DIAG:  Neck pain  Radiculopathy, cervical region  Bilateral carpal tunnel syndrome  Chronic left shoulder  pain  Trigger ring finger of left hand  SUBJECTIVE: Symptoms are same.  PAIN:  Are you having pain? Yes VAS scale: 2/10 Pain location: L Shoulder Pain orientation: Left  PAIN TYPE: aching Pain description: intermittent  Aggravating factors: reaching [place] Relieving factors: rest    OBJECTIVE:  CERVICAL AROM/PROM A/PROM A/PROM (deg) 04/02/21 AROM 04/07/21 AROM 04/09/21  Flexion 42 65 65  Extension 57 57 62  Right lateral flexion 40 40   Left lateral flexion 30 40   Right rotation 54 70 80  Left rotation 65 70 78    Grip strength: L 40lbs  TODAY'S TREATMENT: 04/09/21: Manual therapy: Deep friction massage to rotator cuff tendons and proximal biceps tendon on L Soft tissue mobilization to supraspinatus muscle, levator scapulae and upper trap Grade IV L 1st rib mobilization Movement with mobilization: C6-C6 PA mobilization with cervical retracation and C1-2 PA mobilization with cervical retractions Myofasscial release to anterior scalenes and SCM on Left  Finger extension stretch: 10 x 10" 3rd digit Seated lateral delt raises: elbows bent: 3lbs 2 x 10 R and L Seated shoulder flexion: 2lbs 2 x 10 R and L  04/07/21 Manual therapy: Deep friction massage to rotator cuff tendons and proximal biceps tendon on L Soft tissue mobilization to supraspinatus muscle, levator scapulae and upper trap Grade  IV L 1st rib mobilization Movement with mobilization: C6-C6 PA mobilization with cervical retracation and C1-2 PA mobilization with cervical retractions  TherEx: Seated cervical flexion: 3 x 10 Seated scapular retractions: 2 x 10  PATIENT EDUCATION: Education details: See above Person educated: Patient Education method: Explanation Education comprehension: verbalized understanding     HOME EXERCISE PROGRAM: Access Code [email or web address] Added scapular retractions (04/07/21)- verbally   ASSESSMENT:   CLINICAL IMPRESSION: Pt demonstrated significant improvement in cervical ROM post  manual therapy. Pt demonstrated signficiant improvement in L grip strength post manual therapy (at lower cervical spine)   REHAB POTENTIAL: Good   CLINICAL DECISION MAKING: Stable/uncomplicated   EVALUATION COMPLEXITY: Low     GOALS: Goals reviewed with patient? Yes   SHORT TERM GOALS:   STG Name Target Date Goal status  1 Pt will demo 50 deg of cervical flexion to improve flexibility when reading book/newspaper Comments: 42 deg flexion (04/02/21) 05/24/21 INITIAL  2 Pt will report 50% improvement in L shoulder pain with daily activities to improve function. Comments: 4-5/10 at worst in L shoulder intermittent (04/02/21) 05/24/21 INITIAL  3 Pt will get a trigger finger splint to help manage pain in L ring finger. Comments:  05/24/21 INITIAL    LONG TERM GOALS:    LTG Name Target Date Goal status  1 Pt will demo 5/5 strength with L shoulder flexion and abduction to improve ability to lift heavy pans in [place] cabinets Comments: 3+/5 (04/02/21) 05/14/21 INITIAL  2 Pt will report no shoulder pain in L shoulder with ADLs and self care. Comments: 5/10 L shoulder (04/02/21) 05/14/21 INITIAL  3 Pt will demo at least 70 deg of cervical rotation bil to improve ability to turn head while driving. Comments: R = 54 deg; L = 65 deg (04/02/21) 05/14/21 INITIAL    PLAN: PT FREQUENCY: 1-2x/week   PT DURATION: 6 weeks   PLANNED INTERVENTIONS: Therapeutic exercises, Therapeutic activity, Neuro Muscular re-education, Balance training, Gait training, Patient/Family education, Joint mobilization, Dry Needling, Spinal mobilization, Cryotherapy, Moist heat, Ultrasound and Manual therapy   PLAN FOR NEXT SESSION: Cervical mobilization, shoulder deep friction massage over RTC tendons and proximal biceps tendon, issue cervical flexibility (movement with mobilization for rotation)        Kerrie Pleasure, PT 04/09/2021, 1:21 PM    [facility] [address] [place], [place], [address] Phone: [phone]   Fax:  [phone]  Patient name: Rebecca Harmon MRN: [id number] DOB: 1946/06/22

## 2021-04-12 ENCOUNTER — Other Ambulatory Visit: Payer: Self-pay

## 2021-04-12 ENCOUNTER — Ambulatory Visit (INDEPENDENT_AMBULATORY_CARE_PROVIDER_SITE_OTHER)
Admission: RE | Admit: 2021-04-12 | Discharge: 2021-04-12 | Disposition: A | Discharge status: Home / Self Care | Payer: Self-pay | Source: Ambulatory Visit | Attending: Cardiology | Admitting: Cardiology

## 2021-04-12 DIAGNOSIS — R002 Palpitations: Secondary | ICD-10-CM | POA: Diagnosis not present

## 2021-04-12 DIAGNOSIS — E785 Hyperlipidemia, unspecified: Secondary | ICD-10-CM

## 2021-04-14 ENCOUNTER — Ambulatory Visit: Payer: Medicare HMO

## 2021-04-14 ENCOUNTER — Other Ambulatory Visit: Payer: Self-pay

## 2021-04-14 DIAGNOSIS — M542 Cervicalgia: Secondary | ICD-10-CM

## 2021-04-14 DIAGNOSIS — M25512 Pain in left shoulder: Secondary | ICD-10-CM

## 2021-04-14 DIAGNOSIS — M65342 Trigger finger, left ring finger: Secondary | ICD-10-CM | POA: Diagnosis not present

## 2021-04-14 DIAGNOSIS — G8929 Other chronic pain: Secondary | ICD-10-CM | POA: Diagnosis not present

## 2021-04-14 DIAGNOSIS — M5412 Radiculopathy, cervical region: Secondary | ICD-10-CM | POA: Diagnosis not present

## 2021-04-14 DIAGNOSIS — G5603 Carpal tunnel syndrome, bilateral upper limbs: Secondary | ICD-10-CM | POA: Diagnosis not present

## 2021-04-14 NOTE — Therapy (Signed)
OUTPATIENT PHYSICAL THERAPY TREATMENT NOTE   Patient Name: MEDEA DEINES MRN: [id number] DOB:03-26-1946, 75 y.o., female Today's Date: 04/14/2021  PCP: Kelton Pillar, MD REFERRING PROVIDER: Alda Berthold, DO   PT End of Session - 04/14/21 1315    Visit Number 4    Number of Visits 13    Date for PT Re-Evaluation 05/14/21    Authorization Type [employer] Medicare    Authorization Time Period Cert from 1/6-6/06    Progress Note Due on Visit 10    PT Start Time 1315    PT Stop Time 1400    PT Time Calculation (min) 45 min    Activity Tolerance Patient tolerated treatment well    Behavior During Therapy [facility] for tasks assessed/performed           Past Medical History:  Diagnosis Date  . Allergy    SEASONAL  . Chest pain 2-13 2012   echo EF 50-55% read by Dr. Claiborne Billings pt. of Dr. Ellyn Hack  . Colon polyp    adenomatous  . Diabetes mellitus without complication ([place])   . Dizziness 03-06-2013   stress test Read by Dr. Gwenlyn Found, normal stress test  . Fatty liver   . GERD (gastroesophageal reflux disease)   . Hyperlipidemia   . Hypertension    Past Surgical History:  Procedure Laterality Date  . ABDOMINAL HYSTERECTOMY    . APPENDECTOMY    . BREAST EXCISIONAL BIOPSY Left   . CHOLECYSTECTOMY    . COLONOSCOPY    . THYROIDECTOMY, PARTIAL    . TONSILLECTOMY     x2   Patient Active Problem List   Diagnosis Date Noted  . Change in bowel habits 09/01/2015  . Gastroesophageal reflux disease without esophagitis 09/01/2015  . Chronic abdominal pain 09/01/2015  . Morbid obesity ([place]) 06/12/2015  . CHANGE IN BOWELS 09/30/2009  . ABDOMINAL PAIN-EPIGASTRIC 09/30/2009  . ABDOMINAL PAIN -GENERALIZED 09/30/2009  . DIABETES MELLITUS 01/15/2008  . DYSLIPIDEMIA 01/15/2008  . HYPERTENSION 01/15/2008    REFERRING DIAG: M54.12 (ICD-10-CM) - Cervical radiculopathy     THERAPY DIAG:  Neck pain  Radiculopathy, cervical region  Bilateral carpal tunnel syndrome  Chronic left shoulder  pain  Trigger ring finger of left hand  SUBJECTIVE: Numbness is completely gone from pinky finger. Ring finger is not snapping. Numbness in thumb and other fingers in left hand is about the same. She felt intermittent pain in palm of hand.  PAIN:  Are you having pain? Yes VAS scale: 2/10 Pain location: L Shoulder Pain orientation: Left  PAIN TYPE: aching Pain description: intermittent  Aggravating factors: reaching [place] Relieving factors: rest    OBJECTIVE:  CERVICAL AROM/PROM A/PROM A/PROM (deg) 04/02/21 AROM 04/07/21 AROM 04/09/21  Flexion 42 65 65  Extension 57 57 62  Right lateral flexion 40 40   Left lateral flexion 30 40   Right rotation 54 70 80  Left rotation 65 70 78    Grip strength: L 40lbs  TODAY'S TREATMENT: 04/14/21: STM to plantar aspect of L hand, L wrist extensors Pt recommended tennis elbow strap for L tennis elbow deep friction massage to rotator cuff tendons and proximal biceps tendon on L Soft tissue mobilization to supraspinatus muscle, levator scapulae and upper trap Grade IV L 1st rib mobilization Movement with mobilization: C6-C6 PA mobilization with cervical retracation and C1-2 PA mobilization with cervical retractions Myofasscial release to anterior scalenes and SCM on Left Wrist extensionor stretch: 5 x 30" Seated: manually resisted shoulder ER and IR: 20x  04/09/21: Manual therapy: Deep friction massage to rotator cuff tendons and proximal biceps tendon on L Soft tissue mobilization to supraspinatus muscle, levator scapulae and upper trap Grade IV L 1st rib mobilization Movement with mobilization: C6-C6 PA mobilization with cervical retracation and C1-2 PA mobilization with cervical retractions Myofasscial release to anterior scalenes and SCM on Left  Finger extension stretch: 10 x 10" 3rd digit Seated lateral delt raises: elbows bent: 3lbs 2 x 10 R and L Seated shoulder flexion: 2lbs 2 x 10 R and L  04/07/21 Manual therapy: Deep  friction massage to rotator cuff tendons and proximal biceps tendon on L Soft tissue mobilization to supraspinatus muscle, levator scapulae and upper trap Grade IV L 1st rib mobilization Movement with mobilization: C6-C6 PA mobilization with cervical retracation and C1-2 PA mobilization with cervical retractions  TherEx: Seated cervical flexion: 3 x 10 Seated scapular retractions: 2 x 10  PATIENT EDUCATION: Education details: See above Person educated: Patient Education method: Explanation Education comprehension: verbalized understanding     HOME EXERCISE PROGRAM: Access Code [email or web address] Added scapular retractions (04/07/21)- verbally  Added wrist extensionor stretch: 04/14/21- verbally ASSESSMENT:   CLINICAL IMPRESSION: Pt reported decreased numbness in her L thumb at end of the session. Pt reported full resoulution of numbness in pinky finger since last session.    REHAB POTENTIAL: Good   CLINICAL DECISION MAKING: Stable/uncomplicated   EVALUATION COMPLEXITY: Low     GOALS: Goals reviewed with patient? Yes   SHORT TERM GOALS:   STG Name Target Date Goal status  1 Pt will demo 50 deg of cervical flexion to improve flexibility when reading book/newspaper Comments: 42 deg flexion (04/02/21) 05/24/21 INITIAL  2 Pt will report 50% improvement in L shoulder pain with daily activities to improve function. Comments: 4-5/10 at worst in L shoulder intermittent (04/02/21) 05/24/21 INITIAL  3 Pt will get a trigger finger splint to help manage pain in L ring finger. Comments:  05/24/21 INITIAL    LONG TERM GOALS:    LTG Name Target Date Goal status  1 Pt will demo 5/5 strength with L shoulder flexion and abduction to improve ability to lift heavy pans in [place] cabinets Comments: 3+/5 (04/02/21) 05/14/21 INITIAL  2 Pt will report no shoulder pain in L shoulder with ADLs and self care. Comments: 5/10 L shoulder (04/02/21) 05/14/21 INITIAL  3 Pt will demo at least 70 deg of cervical rotation bil  to improve ability to turn head while driving. Comments: R = 54 deg; L = 65 deg (04/02/21) 05/14/21 INITIAL    PLAN: PT FREQUENCY: 1-2x/week   PT DURATION: 6 weeks   PLANNED INTERVENTIONS: Therapeutic exercises, Therapeutic activity, Neuro Muscular re-education, Balance training, Gait training, Patient/Family education, Joint mobilization, Dry Needling, Spinal mobilization, Cryotherapy, Moist heat, Ultrasound and Manual therapy   PLAN FOR NEXT SESSION: Cervical mobilization, shoulder deep friction massage over RTC tendons and proximal biceps tendon, issue cervical flexibility (movement with mobilization for rotation)        Kerrie Pleasure, PT 04/14/2021, 2:01 PM    [facility] [address] [place], [place], [address] Phone: [phone]   Fax:  [phone]  Patient name: NISREEN GUISE MRN: [id number] DOB: 1946/01/22

## 2021-04-23 ENCOUNTER — Ambulatory Visit: Payer: Medicare HMO

## 2021-04-23 ENCOUNTER — Other Ambulatory Visit: Payer: Self-pay

## 2021-04-23 DIAGNOSIS — M25512 Pain in left shoulder: Secondary | ICD-10-CM | POA: Diagnosis not present

## 2021-04-23 DIAGNOSIS — M5412 Radiculopathy, cervical region: Secondary | ICD-10-CM | POA: Diagnosis not present

## 2021-04-23 DIAGNOSIS — G5603 Carpal tunnel syndrome, bilateral upper limbs: Secondary | ICD-10-CM

## 2021-04-23 DIAGNOSIS — M65342 Trigger finger, left ring finger: Secondary | ICD-10-CM

## 2021-04-23 DIAGNOSIS — G8929 Other chronic pain: Secondary | ICD-10-CM | POA: Diagnosis not present

## 2021-04-23 DIAGNOSIS — M542 Cervicalgia: Secondary | ICD-10-CM

## 2021-04-23 NOTE — Therapy (Signed)
OUTPATIENT PHYSICAL THERAPY TREATMENT NOTE   Patient Name: Rebecca Harmon MRN: [id number] DOB:[birth year], 75 y.o., female Today's Date: 04/23/2021  PCP: Kelton Pillar, MD REFERRING PROVIDER: Alda Berthold, DO   PT End of Session - 04/23/21 1241    Visit Number 5    Number of Visits 13    Date for PT Re-Evaluation 05/14/21    Authorization Type [employer] Medicare    Authorization Time Period Cert from 7/6-8/11    Progress Note Due on Visit 10    PT Start Time 1235    PT Stop Time 1315    PT Time Calculation (min) 40 min    Activity Tolerance Patient tolerated treatment well    Behavior During Therapy [facility] for tasks assessed/performed           Past Medical History:  Diagnosis Date  . Allergy    SEASONAL  . Chest pain 2-13 2012   echo EF 50-55% read by Dr. Claiborne Billings pt. of Dr. Ellyn Hack  . Colon polyp    adenomatous  . Diabetes mellitus without complication ([place])   . Dizziness 03-06-2013   stress test Read by Dr. Gwenlyn Found, normal stress test  . Fatty liver   . GERD (gastroesophageal reflux disease)   . Hyperlipidemia   . Hypertension    Past Surgical History:  Procedure Laterality Date  . ABDOMINAL HYSTERECTOMY    . APPENDECTOMY    . BREAST EXCISIONAL BIOPSY Left   . CHOLECYSTECTOMY    . COLONOSCOPY    . THYROIDECTOMY, PARTIAL    . TONSILLECTOMY     x2   Patient Active Problem List   Diagnosis Date Noted  . Change in bowel habits 09/01/2015  . Gastroesophageal reflux disease without esophagitis 09/01/2015  . Chronic abdominal pain 09/01/2015  . Morbid obesity ([place]) 06/12/2015  . CHANGE IN BOWELS 09/30/2009  . ABDOMINAL PAIN-EPIGASTRIC 09/30/2009  . ABDOMINAL PAIN -GENERALIZED 09/30/2009  . DIABETES MELLITUS 01/15/2008  . DYSLIPIDEMIA 01/15/2008  . HYPERTENSION 01/15/2008    REFERRING DIAG: M54.12 (ICD-10-CM) - Cervical radiculopathy     THERAPY DIAG:  Neck pain  Radiculopathy, cervical region  Bilateral carpal tunnel syndrome  Chronic left shoulder  pain  Trigger ring finger of left hand  SUBJECTIVE: Numbness is completely gone from pinky finger. She feels pain in palm of bil hands in the morning. Numbness in other fingers is about the same.  PAIN:  Are you having pain? Yes VAS scale: 2/10 Pain location: L Shoulder Pain orientation: Left  PAIN TYPE: aching Pain description: intermittent  Aggravating factors: reaching [place] Relieving factors: rest    OBJECTIVE:  CERVICAL AROM/PROM A/PROM A/PROM (deg) 04/02/21 AROM 04/07/21 AROM 04/09/21  Flexion 42 65 65  Extension 57 57 62  Right lateral flexion 40 40   Left lateral flexion 30 40   Right rotation 54 70 80  Left rotation 65 70 78      TODAY'S TREATMENT:  04/23/21: Grip strength: 25lbs on L (max was 40lbs previous session)- pt reported pain in L elbow Soft tissue massage to L wrist extensors L wrist extensor stretching: 5 x 30" L only Wrist extension: L only: 20x, 2lbs Ice cup massage: 5' to L proximal wrist extensor tendons. Pt educated on performing ice cup massage for about 5'. Always keep ice moving. Use ice cup on small affected area only.  Pt educated on trying wrist brace on one hand to see if helps with pain in hand in morning. 04/14/21: STM to plantar aspect of L  hand, L wrist extensors Pt recommended tennis elbow strap for L tennis elbow deep friction massage to rotator cuff tendons and proximal biceps tendon on L Soft tissue mobilization to supraspinatus muscle, levator scapulae and upper trap Grade IV L 1st rib mobilization Movement with mobilization: C6-C6 PA mobilization with cervical retracation and C1-2 PA mobilization with cervical retractions Myofasscial release to anterior scalenes and SCM on Left Wrist extensionor stretch: 5 x 30" Seated: manually resisted shoulder ER and IR: 20x 04/09/21: Manual therapy: Deep friction massage to rotator cuff tendons and proximal biceps tendon on L Soft tissue mobilization to supraspinatus muscle, levator  scapulae and upper trap Grade IV L 1st rib mobilization Movement with mobilization: C6-C6 PA mobilization with cervical retracation and C1-2 PA mobilization with cervical retractions Myofasscial release to anterior scalenes and SCM on Left  Finger extension stretch: 10 x 10" 3rd digit Seated lateral delt raises: elbows bent: 3lbs 2 x 10 R and L Seated shoulder flexion: 2lbs 2 x 10 R and L  04/07/21 Manual therapy: Deep friction massage to rotator cuff tendons and proximal biceps tendon on L Soft tissue mobilization to supraspinatus muscle, levator scapulae and upper trap Grade IV L 1st rib mobilization Movement with mobilization: C6-C6 PA mobilization with cervical retracation and C1-2 PA mobilization with cervical retractions  TherEx: Seated cervical flexion: 3 x 10 Seated scapular retractions: 2 x 10  PATIENT EDUCATION: Education details: See above Person educated: Patient Education method: Explanation Education comprehension: verbalized understanding     HOME EXERCISE PROGRAM: Access Code [email or web address] Added scapular retractions (04/07/21)- verbally  Added wrist extensionor stretch: 04/14/21- verbally ASSESSMENT:   CLINICAL IMPRESSION: Pt's grip strength went from 25lbs to 35 lbs after massage and ice cup therapy with reduction in pain. 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 All short term goals met.    REHAB POTENTIAL: Good   CLINICAL DECISION MAKING: Stable/uncomplicated   EVALUATION COMPLEXITY: Low     GOALS: Goals reviewed with patient? Yes   SHORT TERM GOALS:   STG Name Target Date Goal status  1 Pt will demo 50 deg of cervical flexion to improve flexibility when reading book/newspaper Comments: 42 deg flexion (04/02/21) 04/23/21 Goal met 04/23/21  2 Pt will report 50% improvement in L shoulder pain with daily activities to improve function. Comments: 4-5/10 at worst in L shoulder intermittent (04/02/21) 04/23/21 Goal  met 04/23/21  3 Pt will get a trigger finger splint to help manage pain in L ring finger. Comments:  04/23/21 Goal met  04/23/21    LONG TERM GOALS:    LTG Name Target Date Goal status  1 Pt will demo 5/5 strength with L shoulder flexion and abduction to improve ability to lift heavy pans in [place] cabinets Comments: 3+/5 (04/02/21) 05/14/21 INITIAL  2 Pt will report no shoulder pain in L shoulder with ADLs and self care. Comments: 5/10 L shoulder (04/02/21) 05/14/21 INITIAL  3 Pt will demo at least 70 deg of cervical rotation bil to improve ability to turn head while driving. Comments: R = 54 deg; L = 65 deg (04/02/21) 05/14/21 INITIAL    PLAN: PT FREQUENCY: 1-2x/week   PT DURATION: 6 weeks   PLANNED INTERVENTIONS: Therapeutic exercises, Therapeutic activity, Neuro Muscular re-education, Balance training, Gait training, Patient/Family education, Joint mobilization, Dry Needling, Spinal mobilization, Cryotherapy, Moist heat, Ultrasound and Manual therapy   PLAN FOR NEXT SESSION: Cervical mobilization, shoulder deep friction massage over RTC tendons and proximal  biceps tendon, issue cervical flexibility (movement with mobilization for rotation)        Kerrie Pleasure, PT 04/23/2021, 12:42 PM    [facility] [address] [place], [place], [address] Phone: [phone]   Fax:  [phone]  Patient name: Rebecca Harmon MRN: [id number] DOB: [birth year]

## 2021-04-28 ENCOUNTER — Ambulatory Visit: Payer: Medicare HMO

## 2021-04-30 ENCOUNTER — Ambulatory Visit: Payer: Medicare HMO

## 2021-05-04 DIAGNOSIS — R002 Palpitations: Secondary | ICD-10-CM | POA: Diagnosis not present

## 2021-05-05 ENCOUNTER — Ambulatory Visit: Payer: Medicare HMO

## 2021-05-07 ENCOUNTER — Telehealth: Payer: Self-pay | Admitting: *Deleted

## 2021-05-07 ENCOUNTER — Ambulatory Visit: Payer: Medicare HMO

## 2021-05-07 DIAGNOSIS — R002 Palpitations: Secondary | ICD-10-CM

## 2021-05-07 DIAGNOSIS — R9431 Abnormal electrocardiogram [ECG] [EKG]: Secondary | ICD-10-CM

## 2021-05-07 DIAGNOSIS — I4729 Other ventricular tachycardia: Secondary | ICD-10-CM

## 2021-05-07 DIAGNOSIS — I472 Ventricular tachycardia: Secondary | ICD-10-CM

## 2021-05-07 MED ORDER — METOPROLOL SUCCINATE ER 100 MG PO TB24: ORAL_TABLET | ORAL | 1 refills | Status: DC

## 2021-05-07 NOTE — Telephone Encounter (Signed)
Pt made aware of monitor results and recommendations per Dr.Pemberton. Pt is aware we will order for her to have an echo done.  She is aware that I will place the order in the system and send a message to our [facility] Schedulers to call her back and arrange this test.  Pt agreed to increase her metoprolol succinate to taking 100 mg po in the AM, and 50 mg po in the PM. Confirmed the pharmacy of choice with the pt.  Pt verbalized understanding and agrees with this plan.

## 2021-05-07 NOTE — Telephone Encounter (Signed)
-----   Message from Freada Bergeron, MD sent at 05/07/2021  9:42 AM EDT ----- Her monitor shows several episodes of SVT and brief episodes of runs from the bottom chamber of the heart called nonsustained VT. Given the frequency of the episodes, can we just check an echo to reevaluate the pumping function and valves? If her symptoms are bothersome, we can increase her metoprolol succinate to 100mg  in the AM and 50mg  at night OR we can give her metoprolol tartrate 25mg  TID prn to take as needed for when her palpitations are bothersome in addition to the metop succinate she is already taking. She can choose which one she prefers.

## 2021-05-12 ENCOUNTER — Ambulatory Visit: Payer: Medicare HMO

## 2021-05-14 ENCOUNTER — Ambulatory Visit: Payer: Medicare HMO

## 2021-05-19 ENCOUNTER — Ambulatory Visit: Payer: Medicare HMO

## 2021-05-21 ENCOUNTER — Ambulatory Visit: Payer: Medicare HMO

## 2021-05-27 ENCOUNTER — Ambulatory Visit (HOSPITAL_COMMUNITY): Payer: Medicare HMO | Attending: Internal Medicine

## 2021-05-27 ENCOUNTER — Other Ambulatory Visit: Payer: Self-pay

## 2021-05-27 DIAGNOSIS — R9431 Abnormal electrocardiogram [ECG] [EKG]: Secondary | ICD-10-CM | POA: Diagnosis not present

## 2021-05-27 DIAGNOSIS — R002 Palpitations: Secondary | ICD-10-CM | POA: Insufficient documentation

## 2021-05-27 DIAGNOSIS — I472 Ventricular tachycardia: Secondary | ICD-10-CM | POA: Insufficient documentation

## 2021-05-27 DIAGNOSIS — I4729 Other ventricular tachycardia: Secondary | ICD-10-CM

## 2021-05-27 LAB — ECHOCARDIOGRAM COMPLETE
Area-P 1/2: 4.89 cm2
S' Lateral: 2.3 cm

## 2021-06-03 ENCOUNTER — Ambulatory Visit: Payer: Medicare HMO | Admitting: Neurology

## 2021-06-03 ENCOUNTER — Encounter: Payer: Self-pay | Admitting: Neurology

## 2021-06-03 ENCOUNTER — Other Ambulatory Visit: Payer: Self-pay

## 2021-06-03 VITALS — BP 132/77 | HR 71 | Ht 63.0 in | Wt 160.0 lb

## 2021-06-03 DIAGNOSIS — M5412 Radiculopathy, cervical region: Secondary | ICD-10-CM | POA: Diagnosis not present

## 2021-06-03 DIAGNOSIS — E1140 Type 2 diabetes mellitus with diabetic neuropathy, unspecified: Secondary | ICD-10-CM | POA: Diagnosis not present

## 2021-06-03 DIAGNOSIS — G5601 Carpal tunnel syndrome, right upper limb: Secondary | ICD-10-CM | POA: Diagnosis not present

## 2021-06-03 NOTE — Patient Instructions (Addendum)
Install grab bars in the bathroom  Check feet daily  Take extra caution on uneven ground  If your symptoms get worse, please come back and see me

## 2021-06-03 NOTE — Progress Notes (Signed)
Follow-up Visit   Date: 06/03/21   Rebecca Harmon Harmon: [id number] DOB: 1946/07/07   Interim History: Rebecca Harmon is a 75 y.o. right-handed Caucasian female with diabetes mellitus (HbA1c 8.3), hypertension, and GERD returning to the clinic for follow-up of diabetic neuropathy, right CTS, and cervical radiculopathy.  The patient was accompanied to the clinic by self.  History of present illness:  Starting around 2017, she began having tingling and numbness of the toes which has slowly extended into the entire feet and lower legs. She has predominately numbness and tight sensation, as if a ruberband was squeezing.  It bothers her more when she is resting and in the evening. She has imbalance, does not use a cane and has not had any falls.   Late 2021, she began having tingling involving the 3rd and 4th fingertips of both hands, which is constant.  She also has a cold sensation as if the hands frozen.  No hand weakness.   She intermittently takes gabapentin 200mg  at bedtime which helps her falls asleep, although has not noticed any change in her symptoms.   She has been diabetic for many years with HbA1c ranging ~7.  Recently, she had medication change and was noted to be higher at 8.3.  She does not drink alcohol.  Mother had diabetic neuropathy.  UPDATE 06/03/2021:  She is here for follow-up visit.  Her EDX showed right CTS (moderate) for which she has been wearing a brace and bilateral C6-7 radiculopathy.  She completed neck PT, but did not appreciate any improvement.  Her neuropathy in the feet is stable.  She feels that her balance is a little worse. She continues to walk unassisted and has not had any falls.  She was worried if her hand numbness could be progression of neuropathy into the hands.  There was no evidence of this on NCS.  Medications:  Current Outpatient Medications on File Prior to Visit  Medication Sig Dispense Refill   aspirin EC 81 MG tablet Take 81 mg by  mouth daily.     gabapentin (NEURONTIN) 100 MG capsule Take 100 mg by mouth as needed.     hydrochlorothiazide (HYDRODIURIL) 25 MG tablet Take 25 mg by mouth daily.     losartan (COZAAR) 50 MG tablet Take 1 tablet by mouth daily.     metFORMIN (GLUCOPHAGE) 1000 MG tablet Take 2,000 mg by mouth daily.     metoprolol succinate (TOPROL XL) 100 MG 24 hr tablet Take 1 tablet (100 mg total) by mouth in the AM, then take 1/2 tablet (50 mg total) by mouth at night, everyday. Take with or immediately following a meal. 135 tablet 1   Multiple Vitamins-Minerals (CENTRUM SILVER 50+WOMEN PO) Take by mouth daily.     omeprazole (PRILOSEC) 40 MG capsule Take 40 mg by mouth every morning.      ONE TOUCH ULTRA TEST test strip      rosuvastatin (CRESTOR) 5 MG tablet Take 1 tablet by mouth daily.     No current facility-administered medications on file prior to visit.    Allergies:  Allergies  Allergen Reactions   Codeine Other (See Comments)    dizzy   Tetanus Toxoid    Vytorin [Ezetimibe-Simvastatin] Other (See Comments)    UNSPECIFIED    Vital Signs:  BP 132/77   Pulse 71   Ht 5\' 3"  (1.6 m)   Wt 160 lb (72.6 kg)   SpO2 95%   BMI 28.34 kg/m  Neurological Exam: MENTAL STATUS including orientation to time, place, person, recent and remote memory, attention span and concentration, language, and fund of knowledge is normal.  Speech is not dysarthric.  CRANIAL NERVES:  No visual field defects.  Pupils equal round and reactive to light.  Normal conjugate, extra-ocular eye movements in all directions of gaze.  No ptosis.  Face is symmetric.   MOTOR:  Motor strength is 5/5 in all extremities, including distally in the hands and feet.  No atrophy, fasciculations or abnormal movements.  No pronator drift.  Tone is normal.    MSRs:  Reflexes are 2+/4 throughout, except absent at the ankles.  SENSORY:  Vibration reduced at the ankles and barely present at the great toe bilaterally.  Pin prick reduced  in the lower legs.  Rhomberg sign is present. Sensation in the hands intact.  COORDINATION/GAIT:    Gait narrow based and stable. Unable to perform stressed or tandem gait.  Data: NCS/EMG of the arms 4/13/20220: Right median neuropathy at or distal to the wrist (moderate), consistent with a clinical diagnosis of carpal tunnel syndrome.   Chronic C6-7 radiculopathy affecting bilateral upper extremities, mild-to-moderate.   IMPRESSION/PLAN: Diabetic polyneuropathy affecting the feet manifesting with numbness and sensory ataxia, stable.  Results of EMG discussed with patient which does not show neuropathy affecting the hands.   - Patient educated on daily foot inspection, fall prevention, and safety precautions around the home.  - Continue to optimize diabetes  2.  Cervical radiculopathy manifesting with bilateral hand paresthesias, mild.    - No improvement with PT  - If she develops neck pain or worsening hand paresthesias, the next step his MRI cervical spine  3.  Right carpal tunnel syndrome, moderate.  Patient does not complain of significant paresthesias or weakness  - Continue to wear wrist brace  Return to clinic as needed  Thank you for allowing me to participate in patient's care.  If I can answer any additional questions, I would be pleased to do so.    Sincerely,    Nacole Fluhr K. Posey Pronto, DO

## 2021-06-08 DIAGNOSIS — J019 Acute sinusitis, unspecified: Secondary | ICD-10-CM | POA: Diagnosis not present

## 2021-06-30 DIAGNOSIS — E1165 Type 2 diabetes mellitus with hyperglycemia: Secondary | ICD-10-CM | POA: Diagnosis not present

## 2021-06-30 DIAGNOSIS — J019 Acute sinusitis, unspecified: Secondary | ICD-10-CM | POA: Diagnosis not present

## 2021-06-30 DIAGNOSIS — I10 Essential (primary) hypertension: Secondary | ICD-10-CM | POA: Diagnosis not present

## 2021-08-12 DIAGNOSIS — H90A31 Mixed conductive and sensorineural hearing loss, unilateral, right ear with restricted hearing on the contralateral side: Secondary | ICD-10-CM | POA: Diagnosis not present

## 2021-08-12 DIAGNOSIS — H6981 Other specified disorders of Eustachian tube, right ear: Secondary | ICD-10-CM | POA: Diagnosis not present

## 2021-08-13 DIAGNOSIS — H90A31 Mixed conductive and sensorineural hearing loss, unilateral, right ear with restricted hearing on the contralateral side: Secondary | ICD-10-CM | POA: Diagnosis not present

## 2021-08-13 DIAGNOSIS — H90A22 Sensorineural hearing loss, unilateral, left ear, with restricted hearing on the contralateral side: Secondary | ICD-10-CM | POA: Diagnosis not present

## 2021-09-16 DIAGNOSIS — E1121 Type 2 diabetes mellitus with diabetic nephropathy: Secondary | ICD-10-CM | POA: Diagnosis not present

## 2021-09-16 DIAGNOSIS — G629 Polyneuropathy, unspecified: Secondary | ICD-10-CM | POA: Diagnosis not present

## 2021-09-16 DIAGNOSIS — I129 Hypertensive chronic kidney disease with stage 1 through stage 4 chronic kidney disease, or unspecified chronic kidney disease: Secondary | ICD-10-CM | POA: Diagnosis not present

## 2021-09-16 DIAGNOSIS — H90A31 Mixed conductive and sensorineural hearing loss, unilateral, right ear with restricted hearing on the contralateral side: Secondary | ICD-10-CM | POA: Diagnosis not present

## 2021-09-16 DIAGNOSIS — H6981 Other specified disorders of Eustachian tube, right ear: Secondary | ICD-10-CM | POA: Diagnosis not present

## 2021-09-16 DIAGNOSIS — N183 Chronic kidney disease, stage 3 unspecified: Secondary | ICD-10-CM | POA: Diagnosis not present

## 2021-09-16 DIAGNOSIS — R300 Dysuria: Secondary | ICD-10-CM | POA: Diagnosis not present

## 2021-09-16 DIAGNOSIS — E785 Hyperlipidemia, unspecified: Secondary | ICD-10-CM | POA: Diagnosis not present

## 2021-09-16 DIAGNOSIS — H6521 Chronic serous otitis media, right ear: Secondary | ICD-10-CM | POA: Diagnosis not present

## 2021-09-16 DIAGNOSIS — K594 Anal spasm: Secondary | ICD-10-CM | POA: Diagnosis not present

## 2021-09-27 DIAGNOSIS — H524 Presbyopia: Secondary | ICD-10-CM | POA: Diagnosis not present

## 2022-01-10 DIAGNOSIS — R1110 Vomiting, unspecified: Secondary | ICD-10-CM | POA: Diagnosis not present

## 2022-01-10 DIAGNOSIS — R197 Diarrhea, unspecified: Secondary | ICD-10-CM | POA: Diagnosis not present

## 2022-01-10 DIAGNOSIS — R0981 Nasal congestion: Secondary | ICD-10-CM | POA: Diagnosis not present

## 2022-03-23 DIAGNOSIS — Z0000 Encounter for general adult medical examination without abnormal findings: Secondary | ICD-10-CM | POA: Diagnosis not present

## 2022-03-23 DIAGNOSIS — R1032 Left lower quadrant pain: Secondary | ICD-10-CM | POA: Diagnosis not present

## 2022-03-23 DIAGNOSIS — E785 Hyperlipidemia, unspecified: Secondary | ICD-10-CM | POA: Diagnosis not present

## 2022-03-23 DIAGNOSIS — N183 Chronic kidney disease, stage 3 unspecified: Secondary | ICD-10-CM | POA: Diagnosis not present

## 2022-03-23 DIAGNOSIS — R002 Palpitations: Secondary | ICD-10-CM | POA: Diagnosis not present

## 2022-03-23 DIAGNOSIS — Z13820 Encounter for screening for osteoporosis: Secondary | ICD-10-CM | POA: Diagnosis not present

## 2022-03-23 DIAGNOSIS — I129 Hypertensive chronic kidney disease with stage 1 through stage 4 chronic kidney disease, or unspecified chronic kidney disease: Secondary | ICD-10-CM | POA: Diagnosis not present

## 2022-03-23 DIAGNOSIS — R109 Unspecified abdominal pain: Secondary | ICD-10-CM | POA: Diagnosis not present

## 2022-03-23 DIAGNOSIS — E1121 Type 2 diabetes mellitus with diabetic nephropathy: Secondary | ICD-10-CM | POA: Diagnosis not present

## 2022-03-23 DIAGNOSIS — G629 Polyneuropathy, unspecified: Secondary | ICD-10-CM | POA: Diagnosis not present

## 2022-03-23 DIAGNOSIS — K219 Gastro-esophageal reflux disease without esophagitis: Secondary | ICD-10-CM | POA: Diagnosis not present

## 2022-03-25 ENCOUNTER — Other Ambulatory Visit: Payer: Self-pay | Admitting: Internal Medicine

## 2022-03-25 DIAGNOSIS — E2839 Other primary ovarian failure: Secondary | ICD-10-CM

## 2022-03-25 DIAGNOSIS — Z13820 Encounter for screening for osteoporosis: Secondary | ICD-10-CM

## 2022-04-11 DIAGNOSIS — S70262A Insect bite (nonvenomous), left hip, initial encounter: Secondary | ICD-10-CM | POA: Diagnosis not present

## 2022-04-11 DIAGNOSIS — W57XXXA Bitten or stung by nonvenomous insect and other nonvenomous arthropods, initial encounter: Secondary | ICD-10-CM | POA: Diagnosis not present

## 2022-07-07 DIAGNOSIS — G629 Polyneuropathy, unspecified: Secondary | ICD-10-CM | POA: Diagnosis not present

## 2022-07-07 DIAGNOSIS — M79605 Pain in left leg: Secondary | ICD-10-CM | POA: Diagnosis not present

## 2022-08-10 ENCOUNTER — Ambulatory Visit: Payer: Medicare HMO | Admitting: Neurology

## 2022-08-25 DIAGNOSIS — L820 Inflamed seborrheic keratosis: Secondary | ICD-10-CM | POA: Diagnosis not present

## 2022-08-25 DIAGNOSIS — L570 Actinic keratosis: Secondary | ICD-10-CM | POA: Diagnosis not present

## 2022-09-20 ENCOUNTER — Other Ambulatory Visit: Payer: Self-pay | Admitting: Internal Medicine

## 2022-09-20 ENCOUNTER — Ambulatory Visit
Admission: RE | Admit: 2022-09-20 | Discharge: 2022-09-20 | Disposition: A | Discharge status: Home / Self Care | Payer: Medicare HMO | Source: Ambulatory Visit | Attending: Internal Medicine | Admitting: Internal Medicine

## 2022-09-20 DIAGNOSIS — Z13820 Encounter for screening for osteoporosis: Secondary | ICD-10-CM

## 2022-09-20 DIAGNOSIS — E2839 Other primary ovarian failure: Secondary | ICD-10-CM

## 2022-09-20 DIAGNOSIS — Z780 Asymptomatic menopausal state: Secondary | ICD-10-CM | POA: Diagnosis not present

## 2022-09-20 DIAGNOSIS — Z1231 Encounter for screening mammogram for malignant neoplasm of breast: Secondary | ICD-10-CM

## 2022-09-23 ENCOUNTER — Ambulatory Visit
Admission: RE | Admit: 2022-09-23 | Discharge: 2022-09-23 | Disposition: A | Discharge status: Home / Self Care | Payer: Medicare HMO | Source: Ambulatory Visit | Attending: Internal Medicine | Admitting: Internal Medicine

## 2022-09-23 DIAGNOSIS — Z1231 Encounter for screening mammogram for malignant neoplasm of breast: Secondary | ICD-10-CM | POA: Diagnosis not present

## 2022-10-11 DIAGNOSIS — K529 Noninfective gastroenteritis and colitis, unspecified: Secondary | ICD-10-CM | POA: Diagnosis not present

## 2022-10-11 DIAGNOSIS — E1121 Type 2 diabetes mellitus with diabetic nephropathy: Secondary | ICD-10-CM | POA: Diagnosis not present

## 2022-10-11 DIAGNOSIS — N183 Chronic kidney disease, stage 3 unspecified: Secondary | ICD-10-CM | POA: Diagnosis not present

## 2022-10-11 DIAGNOSIS — G629 Polyneuropathy, unspecified: Secondary | ICD-10-CM | POA: Diagnosis not present

## 2022-10-11 DIAGNOSIS — E785 Hyperlipidemia, unspecified: Secondary | ICD-10-CM | POA: Diagnosis not present

## 2022-10-11 DIAGNOSIS — K219 Gastro-esophageal reflux disease without esophagitis: Secondary | ICD-10-CM | POA: Diagnosis not present

## 2022-10-11 DIAGNOSIS — I129 Hypertensive chronic kidney disease with stage 1 through stage 4 chronic kidney disease, or unspecified chronic kidney disease: Secondary | ICD-10-CM | POA: Diagnosis not present

## 2022-10-11 DIAGNOSIS — R8290 Unspecified abnormal findings in urine: Secondary | ICD-10-CM | POA: Diagnosis not present

## 2022-10-12 DIAGNOSIS — K529 Noninfective gastroenteritis and colitis, unspecified: Secondary | ICD-10-CM | POA: Diagnosis not present

## 2022-10-25 DIAGNOSIS — H9113 Presbycusis, bilateral: Secondary | ICD-10-CM | POA: Diagnosis not present

## 2022-10-25 DIAGNOSIS — H6991 Unspecified Eustachian tube disorder, right ear: Secondary | ICD-10-CM | POA: Diagnosis not present

## 2022-10-25 DIAGNOSIS — H90A31 Mixed conductive and sensorineural hearing loss, unilateral, right ear with restricted hearing on the contralateral side: Secondary | ICD-10-CM | POA: Diagnosis not present

## 2022-10-25 DIAGNOSIS — H6521 Chronic serous otitis media, right ear: Secondary | ICD-10-CM | POA: Diagnosis not present

## 2022-11-10 DIAGNOSIS — E538 Deficiency of other specified B group vitamins: Secondary | ICD-10-CM | POA: Diagnosis not present

## 2022-11-16 DIAGNOSIS — H903 Sensorineural hearing loss, bilateral: Secondary | ICD-10-CM | POA: Diagnosis not present

## 2022-11-17 DIAGNOSIS — E538 Deficiency of other specified B group vitamins: Secondary | ICD-10-CM | POA: Diagnosis not present

## 2022-11-24 DIAGNOSIS — E538 Deficiency of other specified B group vitamins: Secondary | ICD-10-CM | POA: Diagnosis not present

## 2022-11-29 DIAGNOSIS — R42 Dizziness and giddiness: Secondary | ICD-10-CM | POA: Diagnosis not present

## 2022-12-01 DIAGNOSIS — E538 Deficiency of other specified B group vitamins: Secondary | ICD-10-CM | POA: Diagnosis not present

## 2022-12-15 DIAGNOSIS — E538 Deficiency of other specified B group vitamins: Secondary | ICD-10-CM | POA: Diagnosis not present

## 2022-12-29 DIAGNOSIS — E538 Deficiency of other specified B group vitamins: Secondary | ICD-10-CM | POA: Diagnosis not present

## 2023-01-12 DIAGNOSIS — E538 Deficiency of other specified B group vitamins: Secondary | ICD-10-CM | POA: Diagnosis not present

## 2023-01-26 DIAGNOSIS — E538 Deficiency of other specified B group vitamins: Secondary | ICD-10-CM | POA: Diagnosis not present

## 2023-04-12 DIAGNOSIS — R1084 Generalized abdominal pain: Secondary | ICD-10-CM | POA: Diagnosis not present

## 2023-04-12 DIAGNOSIS — R194 Change in bowel habit: Secondary | ICD-10-CM | POA: Diagnosis not present

## 2023-04-12 DIAGNOSIS — M654 Radial styloid tenosynovitis [de Quervain]: Secondary | ICD-10-CM | POA: Diagnosis not present

## 2023-04-12 DIAGNOSIS — E538 Deficiency of other specified B group vitamins: Secondary | ICD-10-CM | POA: Diagnosis not present

## 2023-04-12 DIAGNOSIS — E785 Hyperlipidemia, unspecified: Secondary | ICD-10-CM | POA: Diagnosis not present

## 2023-04-12 DIAGNOSIS — E1140 Type 2 diabetes mellitus with diabetic neuropathy, unspecified: Secondary | ICD-10-CM | POA: Diagnosis not present

## 2023-04-12 DIAGNOSIS — I700 Atherosclerosis of aorta: Secondary | ICD-10-CM | POA: Diagnosis not present

## 2023-04-12 DIAGNOSIS — N183 Chronic kidney disease, stage 3 unspecified: Secondary | ICD-10-CM | POA: Diagnosis not present

## 2023-04-12 DIAGNOSIS — E1121 Type 2 diabetes mellitus with diabetic nephropathy: Secondary | ICD-10-CM | POA: Diagnosis not present

## 2023-04-12 DIAGNOSIS — Z0000 Encounter for general adult medical examination without abnormal findings: Secondary | ICD-10-CM | POA: Diagnosis not present

## 2023-04-12 DIAGNOSIS — I129 Hypertensive chronic kidney disease with stage 1 through stage 4 chronic kidney disease, or unspecified chronic kidney disease: Secondary | ICD-10-CM | POA: Diagnosis not present

## 2023-04-12 DIAGNOSIS — E1165 Type 2 diabetes mellitus with hyperglycemia: Secondary | ICD-10-CM | POA: Diagnosis not present

## 2023-04-12 DIAGNOSIS — K219 Gastro-esophageal reflux disease without esophagitis: Secondary | ICD-10-CM | POA: Diagnosis not present

## 2023-04-12 DIAGNOSIS — K529 Noninfective gastroenteritis and colitis, unspecified: Secondary | ICD-10-CM | POA: Diagnosis not present

## 2023-04-12 DIAGNOSIS — E1122 Type 2 diabetes mellitus with diabetic chronic kidney disease: Secondary | ICD-10-CM | POA: Diagnosis not present

## 2023-04-20 DIAGNOSIS — M65341 Trigger finger, right ring finger: Secondary | ICD-10-CM | POA: Diagnosis not present

## 2023-04-20 DIAGNOSIS — M65342 Trigger finger, left ring finger: Secondary | ICD-10-CM | POA: Diagnosis not present

## 2023-04-20 DIAGNOSIS — M654 Radial styloid tenosynovitis [de Quervain]: Secondary | ICD-10-CM | POA: Diagnosis not present

## 2023-04-26 ENCOUNTER — Encounter: Payer: Self-pay | Admitting: Neurology

## 2023-04-26 ENCOUNTER — Ambulatory Visit (INDEPENDENT_AMBULATORY_CARE_PROVIDER_SITE_OTHER): Payer: Medicare HMO | Admitting: Neurology

## 2023-04-26 VITALS — BP 140/81 | HR 68 | Ht 63.0 in | Wt 161.0 lb

## 2023-04-26 DIAGNOSIS — E1140 Type 2 diabetes mellitus with diabetic neuropathy, unspecified: Secondary | ICD-10-CM

## 2023-04-26 NOTE — Patient Instructions (Signed)
It was nice to see you today.  Please install grab bars in your bathroom  Start to use a cane when walking on uneven ground  If you cannot feel the pedals to control this, it is not safe to drive  Check your feet daily

## 2023-04-26 NOTE — Progress Notes (Signed)
Follow-up Visit   Date: 04/26/23   Rebecca Rebecca MRN: [id number] DOB: Jun 30, 1946   Interim History: Rebecca Rebecca is a 77 y.o. right-handed Caucasian female with diabetes mellitus (HbA1c 8.3), hypertension, and GERD returning to the clinic for follow-up of diabetic neuropathy.  The patient was accompanied to the clinic by self.  History of present illness:  Starting around 2017, she began having tingling and numbness of the toes which has slowly extended into the entire feet and lower legs. She has predominately numbness and tight sensation, as if a ruberband was squeezing.  It bothers her more when she is resting and in the evening. She has imbalance, does not use a cane and has not had any falls.   Late 2021, she began having tingling involving the 3rd and 4th fingertips of both hands, which is constant.  She also has a cold sensation as if the hands frozen.  No hand weakness.   She intermittently takes gabapentin 200mg  at bedtime which helps her falls asleep, although has not noticed any change in her symptoms.   She has been diabetic for many years with HbA1c ranging ~7.  Recently, she had medication change and was noted to be higher at 8.3.  She does not drink alcohol.  Mother had diabetic neuropathy.  UPDATE 06/03/2021:  She is here for follow-up visit.  Her EDX showed right CTS (moderate) for which she has been wearing a brace and bilateral C6-7 radiculopathy.  She completed neck PT, but did not appreciate any improvement.  Her neuropathy in the feet is stable.  She feels that her balance is a little worse. She continues to walk unassisted and has not had any falls.  She was worried if her hand numbness could be progression of neuropathy into the hands.  There was no evidence of this on NCS.  UPDATE 04/26/2023:  She is here because of worsening tight sensation in the legs and feet.  Since her last visit, she has noticed increased tight feeling in the feet, which is now  moving up her lower legs.  She has numbness over the same area.  No tingling, burning, or pain.  No radicular symptoms or cramps.  Her balance is fair.  She walks unassisted and has not fallen, but is very careful.   Medications:  Current Outpatient Medications on File Prior to Visit  Medication Sig Dispense Refill   aspirin EC 81 MG tablet Take 81 mg by mouth daily.     gabapentin (NEURONTIN) 100 MG capsule Take 100 mg by mouth as needed. Prn     hydrochlorothiazide (HYDRODIURIL) 25 MG tablet Take 25 mg by mouth daily.     losartan (COZAAR) 50 MG tablet Take 1 tablet by mouth daily.     metFORMIN (GLUCOPHAGE) 1000 MG tablet Take 2,000 mg by mouth daily.     metoprolol succinate (TOPROL XL) 100 MG 24 hr tablet Take 1 tablet (100 mg total) by mouth in the AM, then take 1/2 tablet (50 mg total) by mouth at night, everyday. Take with or immediately following a meal. 135 tablet 1   Multiple Vitamins-Minerals (CENTRUM SILVER 50+WOMEN PO) Take by mouth daily.     omeprazole (PRILOSEC) 40 MG capsule Take 40 mg by mouth every morning.      ONE TOUCH ULTRA TEST test strip      rosuvastatin (CRESTOR) 5 MG tablet Take 1 tablet by mouth daily.     No current facility-administered medications on file prior to  visit.    Allergies:  Allergies  Allergen Reactions   Codeine Other (See Comments)    dizzy   Tetanus Toxoid    Vytorin [Ezetimibe-Simvastatin] Other (See Comments)    UNSPECIFIED    Vital Signs:  BP (!) 140/81   Pulse 68   Ht 5\' 3"  (1.6 m)   Wt 161 lb (73 kg)   SpO2 94%   BMI 28.52 kg/m    Neurological Exam: MENTAL STATUS including orientation to time, place, person, recent and remote memory, attention span and concentration, language, and fund of knowledge is normal.  Speech is not dysarthric.  CRANIAL NERVES:   Pupils equal round and reactive to light.  Normal conjugate, extra-ocular eye movements in all directions of gaze.  No ptosis.  Face is symmetric.   MOTOR:  Motor  strength is 5/5 in all extremities, including distally in the hands and feet.  No atrophy, fasciculations or abnormal movements.  No pronator drift.  Tone is normal.    MSRs:  Reflexes are 2+/4 throughout, except absent at the ankles.  SENSORY:  Vibration reduced at the ankles and barely present at the great toe bilaterally.  Pin prick reduced in the lower legs.  Rhomberg sign is present. Sensation in the hands intact.  COORDINATION/GAIT:    Gait narrow based and stable. Unable to perform stressed or tandem gait.  Data: NCS/EMG of the arms 4/13/20220: Right median neuropathy at or distal to the wrist (moderate), consistent with a clinical diagnosis of carpal tunnel syndrome.   Chronic C6-7 radiculopathy affecting bilateral upper extremities, mild-to-moderate.   IMPRESSION/PLAN: Diabetic polyneuropathy affecting the feet now progressing into the lower legs, as expected with the natural course of the condition.  She has distal sensory loss and sensory ataxia. Patient educated on daily foot inspection, fall prevention, and safety precautions around the home.  I also encouraged her to install grab bars in the bathroom.  Drive precautions discussed and educated not to drive if she cannot control pedals safely.  For imbalance, PT was declined.  I recommend that she use a cane on uneven ground.    2.  Cervical radiculopathy manifesting with bilateral hand paresthesias, mild.    - If she develops neck pain or worsening hand paresthesias, the next step his MRI cervical spine  3.  Right carpal tunnel syndrome, moderate.  Patient does not complain of significant paresthesias or weakness  Return to clinic as needed  Thank you for allowing me to participate in patient's care.  If I can answer any additional questions, I would be pleased to do so.    Sincerely,    Marci Polito K. Allena Katz, DO

## 2023-07-11 DIAGNOSIS — H90A22 Sensorineural hearing loss, unilateral, left ear, with restricted hearing on the contralateral side: Secondary | ICD-10-CM | POA: Diagnosis not present

## 2023-07-11 DIAGNOSIS — H90A31 Mixed conductive and sensorineural hearing loss, unilateral, right ear with restricted hearing on the contralateral side: Secondary | ICD-10-CM | POA: Diagnosis not present

## 2023-07-11 DIAGNOSIS — H6521 Chronic serous otitis media, right ear: Secondary | ICD-10-CM | POA: Diagnosis not present

## 2023-07-11 DIAGNOSIS — H6991 Unspecified Eustachian tube disorder, right ear: Secondary | ICD-10-CM | POA: Diagnosis not present

## 2023-07-25 NOTE — Progress Notes (Unsigned)
Cardiology Office Note:  .   Date:  07/26/2023  ID:  Rebecca Harmon, DOB 01-14-1946, MRN [id number] PCP: Maurice Small, MD (Inactive)  [facility] Providers Cardiologist:  None    History of Present Illness: .   Rebecca Harmon is a 77 y.o. female with past medical history of DMII, HTN, HLD, palpitations and GERD.   First seen by Dr. Shari Prows on 04/08/2021 for evaluation of palpitations.  She reported having intermittent palpitations that had been progressively getting worse over the prior 6 months.  Each episode lasting 1 to 2 minutes before abating on its own.  She also reported intermittent chest pain that was usually sharp lasting a couple of seconds.  Symptoms were not exertional.  She underwent coronary calcium scoring on 04/12/2021 that showed a coronary calcium score of 13, this was 32nd percentile for age, race and sex matched controls.  She wore a cardiac monitor for 13 days that showed predominant rhythm was normal sinus with an average of 84 bpm, ranging from 56 to 240 bpm.  She had 2 episodes of nonsustained VT with the fastest and longest lasting 9 beats at a rate of 240 bpm.  She had 18 runs of SVT with the fastest interval lasting 18 beats with a heart rate of 179 bpm longest lasting was 18 beats at a rate of 120 bpm.  Her metoprolol succinate was increased to 100 mg in the morning and 50 mg in the evening.  She underwent echocardiogram on 04/2021 that showed LVEF of 65% with LV normal function and no RWMA, she had a mild MR.  Today she presents for follow up. She reports left sided/shoulder pain that radiates into the center of her chest, occurring once a month to every two months, ongoing for the last year. She is unable to associate with exertion, feels it occurs more when at rest. Reports intermittent dizziness with position changes, noted she did have a bought of vertigo this past weekend, notes her dizziness feels different compared to vertigo.  Reports that her  palpitations have overall resolved, she has not noted in several months.  ROS: Today she denies lower extremity edema, fatigue, palpitations, melena, hematuria, hemoptysis, diaphoresis, weakness, presyncope, syncope, orthopnea, and PND.   Studies Reviewed: Marland Kitchen   EKG Interpretation Date/Time:  Wednesday July 26 2023 15:31:56 EDT Ventricular Rate:  85 PR Interval:  154 QRS Duration:  64 QT Interval:  374 QTC Calculation: 445 R Axis:   36  Text Interpretation: Normal sinus rhythm No acute changes compared to prior EKG Confirmed by Reather Littler [id number]) on 07/26/2023 3:39:47 PM   Cardiac Studies & Procedures       ECHOCARDIOGRAM  ECHOCARDIOGRAM COMPLETE 05/27/2021  Narrative ECHOCARDIOGRAM REPORT    Patient Name:   Rebecca Harmon Date of Exam: 05/27/2021 Medical Rec #:  [id number]          Height:       63.0 in Accession #:    [id number]         Weight:       160.6 lb Date of Birth:  February 02, 1946         BSA:          1.761 m Patient Age:    74 years           BP:           136/90 mmHg Patient Gender: F  HR:           74 bpm. Exam Location:  [address]  Procedure: 2D Echo, Cardiac Doppler and Color Doppler  Indications:    I47.2 Nonsustained Ventricular tachycardia; R94.31 Abnormal Holter moniter  History:        Patient has prior history of Echocardiogram examinations, most recent 01/20/2011. Risk Factors:Hypertension, Diabetes and Dyslipidemia. Palpitations. Chronic abdominal pain.  Sonographer:    Rebecca Harmon RCS Referring Phys: [id number] HEATHER E P[place]  IMPRESSIONS   1. Left ventricular ejection fraction, by estimation, is 65%. The left ventricle has normal function. The left ventricle has no regional wall motion abnormalities. Left ventricular diastolic parameters were normal. 2. Right ventricular systolic function is normal. The right ventricular size is normal. Tricuspid regurgitation signal is inadequate for assessing PA pressure. 3. The  mitral valve is normal in structure. Mild mitral valve regurgitation. No evidence of mitral stenosis. 4. The aortic valve is grossly normal. Aortic valve regurgitation is not visualized. No aortic stenosis is present. 5. The inferior vena cava is normal in size with greater than 50% respiratory variability, suggesting right atrial pressure of 3 mmHg.  FINDINGS Left Ventricle: Left ventricular ejection fraction, by estimation, is 65%. The left ventricle has normal function. The left ventricle has no regional wall motion abnormalities. The left ventricular internal cavity size was normal in size. There is no left ventricular hypertrophy. Left ventricular diastolic parameters were normal.  Right Ventricle: The right ventricular size is normal. No increase in right ventricular wall thickness. Right ventricular systolic function is normal. Tricuspid regurgitation signal is inadequate for assessing PA pressure.  Left Atrium: Left atrial size was normal in size.  Right Atrium: Right atrial size was normal in size.  Pericardium: There is no evidence of pericardial effusion. Presence of pericardial fat pad.  Mitral Valve: The mitral valve is normal in structure. Mild mitral valve regurgitation. No evidence of mitral valve stenosis.  Tricuspid Valve: The tricuspid valve is normal in structure. Tricuspid valve regurgitation is trivial. No evidence of tricuspid stenosis.  Aortic Valve: The aortic valve is grossly normal. Aortic valve regurgitation is not visualized. No aortic stenosis is present.  Pulmonic Valve: The pulmonic valve was grossly normal. Pulmonic valve regurgitation is not visualized. No evidence of pulmonic stenosis.  Aorta: The aortic root is normal in size and structure.  Venous: The inferior vena cava is normal in size with greater than 50% respiratory variability, suggesting right atrial pressure of 3 mmHg.  IAS/Shunts: No atrial level shunt detected by color flow  Doppler.   LEFT VENTRICLE PLAX 2D LVIDd:         3.90 cm  Diastology LVIDs:         2.30 cm  LV e' medial:    6.89 cm/s LV PW:         0.70 cm  LV E/e' medial:  11.6 LV IVS:        0.80 cm  LV e' lateral:   9.32 cm/s LVOT diam:     1.65 cm  LV E/e' lateral: 8.6 LV SV:         40 LV SV Index:   23 LVOT Area:     2.14 cm   RIGHT VENTRICLE RV S prime:     10.60 cm/s TAPSE (M-mode): 1.8 cm  LEFT ATRIUM             Index LA diam:        3.70 cm 2.10 cm/m LA Vol (A2C):  29.5 ml 16.75 ml/m LA Vol (A4C):   37.1 ml 21.06 ml/m LA Biplane Vol: 33.8 ml 19.19 ml/m AORTIC VALVE LVOT Vmax:   86.50 cm/s LVOT Vmean:  58.300 cm/s LVOT VTI:    0.188 m  AORTA Ao Root diam: 2.90 cm Ao Asc diam:  3.10 cm  MITRAL VALVE MV Area (PHT): 4.89 cm    SHUNTS MV Decel Time: 155 msec    Systemic VTI:  0.19 m MV E velocity: 80.10 cm/s  Systemic Diam: 1.65 cm MV A velocity: 80.10 cm/s MV E/A ratio:  1.00  Rebecca Brass MD Electronically signed by Rebecca Brass MD Signature Date/Time: 05/27/2021/2:09:08 PM    Final    MONITORS  LONG TERM MONITOR (3-14 DAYS) 05/04/2021  Narrative  Patch wear time was 13 days  Predominant rhythm was NSR with average HR 84bpm; ranging from 56-240bpm  There were two episodes of nonsustained VT with the fastest and longest lasting 9 beats at rate 240bpm (did not correlate with patient triggered event).  There were 18 runs of SVT with the fastest interval lasting 18beats with HR 179bpm; longest lasting was 18 beats at rate 120bpm (did not correlate with patient triggered event).  Rare SVE (<1%), rare VE (<1%)  Patient triggered events correlated with NSR, SVE and PVCs.  No Afib or significant pauses.   Patch Wear Time:  13 days and 0 hours (2022-05-16T22:03:16-0400 to 2022-05-29T22:18:58-0400)  Patient had a min HR of 56 bpm, max HR of 240 bpm, and avg HR of 84 bpm. Predominant underlying rhythm was Sinus Rhythm. 2 Ventricular Tachycardia runs  occurred, the run with the fastest interval lasting 9 beats with a max rate of 240 bpm (avg 193 bpm); the run with the fastest interval was also the longest. 18 Supraventricular Tachycardia runs occurred, the run with the fastest interval lasting 18 beats with a max rate of 179 bpm, the longest lasting 18 beats with an avg rate of 120 bpm. Some episodes of Supraventricular Tachycardia may be possible Atrial Tachycardia with variable block. Isolated SVEs were rare (<1.0%), SVE Couplets were rare (<1.0%), and SVE Triplets were rare (<1.0%). Isolated VEs were rare (<1.0%), and no VE Couplets or VE Triplets were present. Ventricular Trigeminy was present.  Rebecca Flatten, MD   CT SCANS  CT CARDIAC SCORING (SELF PAY ONLY) 04/12/2021  Addendum 04/12/2021  3:38 PM ADDENDUM REPORT: 04/12/2021 15:36  CLINICAL DATA:  Cardiovascular Disease Risk stratification  EXAM: Coronary Calcium Score  TECHNIQUE: A gated, non-contrast computed tomography scan of the heart was performed using 3mm slice thickness. Axial images were analyzed on a dedicated workstation. Calcium scoring of the coronary arteries was performed using the Agatston method.  FINDINGS: Coronary arteries: Normal origins.  Coronary Calcium Score:  Left main: 0  Left anterior descending artery: 1  Left circumflex artery: 11  Right coronary artery: 1  Total: 13  Percentile: 32  Pericardium: Normal.  Ascending Aorta: Normal caliber.  Non-cardiac: See separate report from [facility].  IMPRESSION: Coronary calcium score of 13. This was 32nd percentile for age-, race-, and sex-matched controls.  RECOMMENDATIONS: Coronary artery calcium (CAC) score is a strong predictor of incident coronary heart disease (CHD) and provides predictive information beyond traditional risk factors. CAC scoring is reasonable to use in the decision to withhold, postpone, or initiate statin therapy in intermediate-risk or selected  borderline-risk asymptomatic adults (age 101-75 years and LDL-C >=70 to <190 mg/dL) who do not have diabetes or established atherosclerotic cardiovascular disease (ASCVD).* In intermediate-risk (10-year  ASCVD risk >=7.5% to <20%) adults or selected borderline-risk (10-year ASCVD risk >=5% to <7.5%) adults in whom a CAC score is measured for the purpose of making a treatment decision the following recommendations have been made:  If CAC=0, it is reasonable to withhold statin therapy and reassess in 5 to 10 years, as long as higher risk conditions are absent (diabetes mellitus, family history of premature CHD in first degree relatives (males <55 years; females <65 years), cigarette smoking, or LDL >=190 mg/dL).  If CAC is 1 to 99, it is reasonable to initiate statin therapy for patients >=59 years of age.  If CAC is >=100 or >=75th percentile, it is reasonable to initiate statin therapy at any age.  Cardiology referral should be considered for patients with CAC scores >=400 or >=75th percentile.  *2018 AHA/ACC/AACVPR/AAPA/ABC/ACPM/ADA/AGS/APhA/ASPC/NLA/PCNA Guideline on the Management of Blood Cholesterol: A Report of the American College of Cardiology/American Heart Association Task Force on Clinical Practice Guidelines. J Am Coll Cardiol. 2019;73(24):3168-3209.  Rebecca Lesches, MD   Electronically Signed By: Rebecca Lesches MD On: 04/12/2021 15:36  Narrative EXAM: OVER-READ INTERPRETATION  CT CHEST  The following report is an over-read performed by radiologist Dr. Trudie Reed of [facility] on 04/12/2021. This over-read does not include interpretation of cardiac or coronary anatomy or pathology. The coronary calcium score interpretation by the cardiologist is attached.  COMPARISON:  None.  FINDINGS: Within the visualized portions of the thorax there are no suspicious appearing pulmonary nodules or masses, there is no acute consolidative  airspace disease, no pleural effusions, no pneumothorax and no lymphadenopathy. Visualized portions of the upper abdomen are unremarkable. There are no aggressive appearing lytic or blastic lesions noted in the visualized portions of the skeleton.  IMPRESSION: 1. No significant incidental noncardiac findings are noted.  Electronically Signed: By: Trudie Reed M.D. On: 04/12/2021 10:10                   Physical Exam:   VS:  BP 128/70   Pulse 85   Ht 5\' 3"  (1.6 m)   Wt 160 lb 12.8 oz (72.9 kg)   SpO2 94%   BMI 28.48 kg/m    Wt Readings from Last 3 Encounters:  07/26/23 160 lb 12.8 oz (72.9 kg)  04/26/23 161 lb (73 kg)  06/03/21 160 lb (72.6 kg)    GEN: Well nourished, well developed in no acute distress NECK: No JVD; No carotid bruits CARDIAC: RRR, no murmurs, rubs, gallops RESPIRATORY:  Clear to auscultation without rales, wheezing or rhonchi  ABDOMEN: Soft, non-tender, non-distended EXTREMITIES:  No edema; No deformity     ASSESSMENT AND PLAN: .    Precordial/atypical chest pain/DOE:  Underwent coronary calcium scoring on 04/12/2021 that showed a coronary calcium score of 13, this was 32nd percentile for age, race and sex matched controls. Echocardiogram on 04/2021 that showed LVEF of 65% with LV normal function and no RWMA, she had a mild MR.Today she reports left sided/shoulder pain that radiates into the center of her chest occurring once a month, not associated with exertion, lasting a few minutes.  She also endorses dyspnea on exertion for the past few months, denies lower extremity edema, orthopnea or PND.  EKG today shows normal sinus rhythm, no acute changes compared to prior.  Reviewed ED precautions.  Repeat echocardiogram.  Will have her complete coronary CTA for evaluation, she will hold her Toprol morning of and take Lopressor prior to coronary CTA. Check BMET today. Continue  losartan, Toprol and rosuvastatin.   Palpitations: In 04/2021 she wore a cardiac  monitor for 13 days that showed predominant rhythm was normal sinus with an average of 84 bpm, ranging from 56 to 240 bpm.  She had 2 episodes of nonsustained VT with the fastest and longest lasting 9 beats at a rate of 240 bpm.  She had 18 runs of SVT with the fastest interval lasting 18 beats with a heart rate of 179 bpm longest lasting was 18 beats at a rate of 120 bpm.  Her metoprolol succinate was increased to 100 mg in the morning and 50 mg in the evening. Reports today that her palpitations have improved, has not had any recently.   HTN/Dizziness: Initial blood pressure today 140/72, on recheck was 128/70.  She reports intermittent dizziness with position changes.  Orthostatics indicate a decrease in blood pressure from laying at 153/78 to sitting at 131/78. Will decrease her hydrochlorothiazide to 12.5mg  daily.  Encouraged to stay well-hydrated. Continue losartan.   Hyperlipidemia: Last lipid profile on 04/12/23 showed cholesterol 142, HDL 39, triglycerides 314 and LDL 55. Monitored and managed by PCP. Continue rosuvastatin 5 mg daily.      Dispo: Follow up in 6 months or sooner pending testing results.   Signed, Rip Harbour, NP

## 2023-07-26 ENCOUNTER — Ambulatory Visit: Payer: Medicare HMO | Attending: Physician Assistant | Admitting: Cardiology

## 2023-07-26 ENCOUNTER — Encounter: Payer: Self-pay | Admitting: Cardiology

## 2023-07-26 VITALS — BP 128/70 | HR 85 | Ht 63.0 in | Wt 160.8 lb

## 2023-07-26 DIAGNOSIS — E785 Hyperlipidemia, unspecified: Secondary | ICD-10-CM

## 2023-07-26 DIAGNOSIS — R42 Dizziness and giddiness: Secondary | ICD-10-CM | POA: Diagnosis not present

## 2023-07-26 DIAGNOSIS — R072 Precordial pain: Secondary | ICD-10-CM

## 2023-07-26 DIAGNOSIS — R002 Palpitations: Secondary | ICD-10-CM | POA: Diagnosis not present

## 2023-07-26 DIAGNOSIS — R0602 Shortness of breath: Secondary | ICD-10-CM | POA: Diagnosis not present

## 2023-07-26 DIAGNOSIS — I10 Essential (primary) hypertension: Secondary | ICD-10-CM | POA: Diagnosis not present

## 2023-07-26 MED ORDER — METOPROLOL TARTRATE 100 MG PO TABS: ORAL_TABLET | ORAL | 0 refills | Status: AC

## 2023-07-26 MED ORDER — HYDROCHLOROTHIAZIDE 25 MG PO TABS: 12.5000 mg | ORAL_TABLET | Freq: Every day | ORAL | Status: AC

## 2023-07-26 NOTE — Patient Instructions (Addendum)
Medication Instructions:  Your physician has recommended you make the following change in your medication:   REDUCE the Hydrochlorothiazide to 1/2 tablet = 12.5 daily  *If you need a refill on your cardiac medications before your next appointment, please call your pharmacy*   Lab Work: TODAY:  BMET  If you have labs (blood work) drawn today and your tests are completely normal, you will receive your results only by: MyChart Message (if you have MyChart) OR A paper copy in the mail If you have any lab test that is abnormal or we need to change your treatment, we will call you to review the results.   Testing/Procedures: Your physician has requested that you have an echocardiogram. Echocardiography is a painless test that uses sound waves to create images of your heart. It provides your doctor with information about the size and shape of your heart and how well your heart's chambers and valves are working. This procedure takes approximately one hour. There are no restrictions for this procedure. Please do NOT wear cologne, perfume, aftershave, or lotions (deodorant is allowed). Please arrive 15 minutes prior to your appointment time.   Your physician has requested that you have cardiac CT. Cardiac computed tomography (CT) is a painless test that uses an x-ray machine to take clear, detailed pictures of your heart. For further information please visit [email or web address] Please follow instruction sheet BELOW:      Your cardiac CT will be scheduled at one of the below locations:   [facility] [address] [place], [place] [address] [phone]   If scheduled at [facility], please arrive at the [facility] and Children's Entrance (Entrance C2) of [facility] 30 minutes prior to test start time. You can use the FREE valet parking offered at entrance C (encouraged to control the heart rate for the test)  Proceed to the [facility] Radiology Department  (first floor) to check-in and test prep.  All radiology patients and guests should use entrance C2 at [facility], accessed from [facility], even though the hospital's physical address listed is [address].     Please follow these instructions carefully (unless otherwise directed):  An IV will be required for this test and Nitroglycerin will be given.    On the Night Before the Test: Be sure to Drink plenty of water. Do not consume any caffeinated/decaffeinated beverages or chocolate 12 hours prior to your test. Do not take any antihistamines 12 hours prior to your test.   On the Day of the Test: Drink plenty of water until 1 hour prior to the test. Do not eat any food 1 hour prior to test. You may take your regular medications prior to the test.  Take metoprolol (Lopressor) 100 MG two hours prior to test DO NOT TAKE THE DAILY  METOPROLOL ON THIS DAY . If you take HOLD Hydrochlorothiazide/Spironolactone, HOLD on the morning of the test. FEMALES- please wear underwire-free bra if available, avoid dresses & tight clothing       After the Test: Drink plenty of water. After receiving IV contrast, you may experience a mild flushed feeling. This is normal. On occasion, you may experience a mild rash up to 24 hours after the test. This is not dangerous. If this occurs, you can take Benadryl 25 mg and increase your fluid intake. If you experience trouble breathing, this can be serious. If it is severe call 911 IMMEDIATELY. If it is mild, please call our  office. HOLD THE METFORMIN THE MORNING OF YOUR TEST AND DO NOT RESTART UNTIL 48 HOURS AFTER  We will call to schedule your test 2-4 weeks out understanding that some insurance companies will need an authorization prior to the service being performed.   For more information and frequently asked questions, please visit our website : [email or web address]  For non-scheduling related questions,  please contact the cardiac imaging nurse navigator should you have any questions/concerns: Cardiac Imaging Nurse Navigators Direct Office Dial: [phone]   For scheduling needs, including cancellations and rescheduling, please call [place], [phone].    Follow-Up: At [facility], you and your health needs are our priority.  As part of our continuing mission to provide you with exceptional heart care, we have created designated Provider Care Teams.  These Care Teams include your primary Cardiologist (physician) and Advanced Practice Providers (APPs -  Physician Assistants and Nurse Practitioners) who all work together to provide you with the care you need, when you need it.  We recommend signing up for the patient portal called "MyChart".  Sign up information is provided on this After Visit Summary.  MyChart is used to connect with patients for Virtual Visits (Telemedicine).  Patients are able to view lab/test results, encounter notes, upcoming appointments, etc.  Non-urgent messages can be sent to your provider as well.   To learn more about what you can do with MyChart, go to [email or web address].    Your next appointment:   6 month(s)  Provider:    Dr. Eden Emms  Other Instructions

## 2023-07-27 ENCOUNTER — Encounter: Payer: Self-pay | Admitting: Internal Medicine

## 2023-07-27 ENCOUNTER — Ambulatory Visit: Payer: Medicare HMO | Admitting: Internal Medicine

## 2023-07-27 VITALS — BP 122/80 | HR 78 | Ht 63.0 in | Wt 161.0 lb

## 2023-07-27 DIAGNOSIS — R1084 Generalized abdominal pain: Secondary | ICD-10-CM | POA: Diagnosis not present

## 2023-07-27 DIAGNOSIS — R1013 Epigastric pain: Secondary | ICD-10-CM

## 2023-07-27 DIAGNOSIS — R197 Diarrhea, unspecified: Secondary | ICD-10-CM

## 2023-07-27 DIAGNOSIS — K219 Gastro-esophageal reflux disease without esophagitis: Secondary | ICD-10-CM | POA: Diagnosis not present

## 2023-07-27 DIAGNOSIS — Z86010 Personal history of colonic polyps: Secondary | ICD-10-CM

## 2023-07-27 LAB — BASIC METABOLIC PANEL
BUN/Creatinine Ratio: 24 (ref 12–28)
BUN: 24 mg/dL (ref 8–27)
CO2: 24 mmol/L (ref 20–29)
Calcium: 9.5 mg/dL (ref 8.7–10.3)
Chloride: 102 mmol/L (ref 96–106)
Creatinine, Ser: 0.99 mg/dL (ref 0.57–1.00)
Glucose: 167 mg/dL — ABNORMAL HIGH (ref 70–99)
Potassium: 3.8 mmol/L (ref 3.5–5.2)
Sodium: 142 mmol/L (ref 134–144)
eGFR: 58 mL/min/{1.73_m2} — ABNORMAL LOW (ref >59)

## 2023-07-27 MED ORDER — PLENVU 140 G PO SOLR: 1.0000 | Freq: Once | ORAL | 0 refills | Status: AC

## 2023-07-27 MED ORDER — ONDANSETRON HCL 4 MG PO TABS: ORAL_TABLET | ORAL | 0 refills | Status: AC

## 2023-07-27 NOTE — Progress Notes (Signed)
HISTORY OF PRESENT ILLNESS:  Rebecca Harmon is a 77 y.o. female with past medical history as listed below who presents today at the request of her primary care provider regarding chronic abdominal pain and diarrhea.  I last saw the patient in the office July 27, 2016 regarding chronic abdominal pain felt most likely secondary to adhesions or musculoskeletal etiology.  Prior extensive workups were negative.  She was also complaining of alternating bowel habits that point, on metformin.  Other issues were GERD, minor rectal bleeding, and a history of diminutive adenoma.  See that dictation for details.  She was continued on PPI for GERD.  Told to take Metamucil for alternating bowel habits.  And advised for surveillance colonoscopy around November 2021.  She has not been seen since.  Colonoscopy September 29, 2015.  Diminutive adenoma.  Otherwise normal. Upper endoscopy September 29, 2015.  Normal.  Patient tells me that she continues with upper abdominal pain.  She describes it in the epigastric region at times which is relieved with meals.  She also describes a second type of pain that occurs after straightening up from a bending posture, such as tying her shoes.  Finally, she mentioned a transient right lower cramping discomfort.  She tells me that she has diarrhea 7 days/week.  Currently on 2000 mg of metformin daily.  In terms of GERD, she tells me that her symptoms are nicely controlled with omeprazole.  When she comes off of omeprazole, significant GERD symptoms by day 3.  No dysphagia.  Her weight has been stable.  No bleeding.  Previous abdominal ultrasound and CT scan from 2017 reviewed.  No significant abnormalities.  She is status postcholecystectomy.  REVIEW OF SYSTEMS:  All non-GI ROS negative unless otherwise stated in the HPI except for seasonal allergies  Past Medical History:  Diagnosis Date   Allergy    SEASONAL   Chest pain 2-13 2012   echo EF 50-55% read by Dr. Tresa Endo pt. of  Dr. Herbie Baltimore   Colon polyp    adenomatous   Diabetes mellitus without complication [facility])    Dizziness 03-06-2013   stress test Read by Dr. Allyson Sabal, normal stress test   Fatty liver    GERD (gastroesophageal reflux disease)    Hyperlipidemia    Hypertension     Past Surgical History:  Procedure Laterality Date   ABDOMINAL HYSTERECTOMY     APPENDECTOMY     BREAST EXCISIONAL BIOPSY Left    CHOLECYSTECTOMY     COLONOSCOPY     THYROIDECTOMY, PARTIAL     TONSILLECTOMY     x2    Social History Rebecca Harmon  reports that she has quit smoking. She has never used smokeless tobacco. She reports that she does not drink alcohol and does not use drugs.  family history includes Diabetes in her mother and sister; Heart disease in her father.  Allergies  Allergen Reactions   Codeine Other (See Comments)    dizzy   Tetanus Toxoid    Vytorin [Ezetimibe-Simvastatin] Other (See Comments)    UNSPECIFIED       PHYSICAL EXAMINATION: Vital signs: BP 122/80   Pulse 78   Ht 5\' 3"  (1.6 m)   Wt 161 lb (73 kg)   BMI 28.52 kg/m   Constitutional: generally well-appearing, no acute distress Psychiatric: alert and oriented x3, cooperative Eyes: extraocular movements intact, anicteric, conjunctiva pink Mouth: oral pharynx moist, no lesions Neck: supple no lymphadenopathy Cardiovascular: heart regular rate and rhythm, no murmur Lungs:  clear to auscultation bilaterally Abdomen: soft, nontender, nondistended, no obvious ascites, no peritoneal signs, normal bowel sounds, no organomegaly Rectal: Deferred until colonoscopy Extremities: no clubbing, cyanosis, or lower extremity edema bilaterally Skin: no lesions on visible extremities Neuro: No focal deficits.  Cranial nerves intact  ASSESSMENT:  1.  Personal history of adenomatous colon polyp.  Due for surveillance 2.  Chronic diarrhea.  Suspect metformin.  Rule out microscopic colitis.  Rule out sprue. 3.  Chronic GERD.  Require PPI to  control symptoms 4.  Epigastric pain improved with meals.  Rule out ulcer. 5.  Chronic abdominal pain as described.  Seemingly similar to previous and remote evaluations.  Suspect musculoskeletal or adhesions 6.  Multiple medical problems including diabetes.  Stable   PLAN:  1.  Reflux precautions 2.  Continue omeprazole daily 3.  Schedule colonoscopy with biopsies for the purposes of surveillance and to evaluate diarrhea.The nature of the procedure, as well as the risks, benefits, and alternatives were carefully and thoroughly reviewed with the patient. Ample time for discussion and questions allowed. The patient understood, was satisfied, and agreed to proceed. 4.  Schedule upper endoscopy to evaluate epigastric pain affected by meals.  Consider duodenal biopsies.The nature of the procedure, as well as the risks, benefits, and alternatives were carefully and thoroughly reviewed with the patient. Ample time for discussion and questions allowed. The patient understood, was satisfied, and agreed to proceed. 5.  Patient states she has had difficulty with preps in the past regarding prep related nausea.  Will prescribe Zofran 4 mg to be taken 20 minutes before each prep session. A total time of 60 minutes was spent preparing to see the patient, reviewing multiple records and data, obtaining comprehensive history, performing medically appropriate physical examination, counseling and educating the patient regarding the above listed issues, ordering multiple endoscopic procedures, ordering medication, adjusting diabetic medications preprocedure, and documenting clinical information in the health record

## 2023-07-27 NOTE — Patient Instructions (Signed)
We have sent the following medications to your pharmacy for you to pick up at your convenience:  Zofran - take one tablet 20 minutes before drinking each half of the prep.  You have been scheduled for an endoscopy and colonoscopy. Please follow the written instructions given to you at your visit today.  Please pick up your prep supplies at the pharmacy within the next 1-3 days.  If you use inhalers (even only as needed), please bring them with you on the day of your procedure.  DO NOT TAKE 7 DAYS PRIOR TO TEST- Trulicity (dulaglutide) Ozempic, Wegovy (semaglutide) Mounjaro (tirzepatide) Bydureon Bcise (exanatide extended release)  DO NOT TAKE 1 DAY PRIOR TO YOUR TEST Rybelsus (semaglutide) Adlyxin (lixisenatide) Victoza (liraglutide) Byetta (exanatide) ___________________________________________________________________________   _______________________________________________________  If your blood pressure at your visit was 140/90 or greater, please contact your primary care physician to follow up on this.  _______________________________________________________  If you are age 6 or older, your body mass index should be between 23-30. Your Body mass index is 28.52 kg/m. If this is out of the aforementioned range listed, please consider follow up with your Primary Care Provider.  If you are age 26 or younger, your body mass index should be between 19-25. Your Body mass index is 28.52 kg/m. If this is out of the aformentioned range listed, please consider follow up with your Primary Care Provider.   ________________________________________________________  The [facility] providers would like to encourage you to use [facility] to communicate with providers for non-urgent requests or questions.  Due to long hold times on the telephone, sending your provider a message by [facility] may be a faster and more efficient way to get a response.  Please allow 48 business hours for a response.   Please remember that this is for non-urgent requests.  _______________________________________________________

## 2023-08-01 ENCOUNTER — Encounter (HOSPITAL_COMMUNITY): Payer: Self-pay

## 2023-08-03 ENCOUNTER — Ambulatory Visit (HOSPITAL_COMMUNITY)
Admission: RE | Admit: 2023-08-03 | Discharge: 2023-08-03 | Disposition: A | Discharge status: Home / Self Care | Payer: Medicare HMO | Source: Ambulatory Visit | Attending: Cardiology | Admitting: Cardiology

## 2023-08-03 DIAGNOSIS — R072 Precordial pain: Secondary | ICD-10-CM | POA: Diagnosis not present

## 2023-08-03 DIAGNOSIS — R002 Palpitations: Secondary | ICD-10-CM | POA: Diagnosis not present

## 2023-08-03 DIAGNOSIS — R0602 Shortness of breath: Secondary | ICD-10-CM | POA: Diagnosis not present

## 2023-08-03 DIAGNOSIS — I10 Essential (primary) hypertension: Secondary | ICD-10-CM | POA: Diagnosis not present

## 2023-08-03 MED ORDER — NITROGLYCERIN 0.4 MG SL SUBL
SUBLINGUAL_TABLET | SUBLINGUAL | Status: AC
  Filled 2023-08-03: qty 2

## 2023-08-03 MED ORDER — IOHEXOL 350 MG/ML SOLN
100.0000 mL | Freq: Once | INTRAVENOUS | Status: AC | PRN
  Administered 2023-08-03: 100 mL via INTRAVENOUS

## 2023-08-03 MED ORDER — NITROGLYCERIN 0.4 MG SL SUBL
0.8000 mg | SUBLINGUAL_TABLET | Freq: Once | SUBLINGUAL | Status: AC
  Administered 2023-08-03: 0.8 mg via SUBLINGUAL

## 2023-08-07 ENCOUNTER — Telehealth: Payer: Self-pay | Admitting: *Deleted

## 2023-08-07 MED ORDER — ASPIRIN 81 MG PO TBEC: 81.0000 mg | DELAYED_RELEASE_TABLET | Freq: Every day | ORAL | Status: AC

## 2023-08-07 NOTE — Telephone Encounter (Signed)
-----   Message from Rip Harbour sent at 08/07/2023 12:53 PM EDT ----- Please let Rebecca Harmon know that her coronary CTA shows some mild non obstructive plaque in her coronary arteries, overall reassuring her chest pain isn't from coronary disease. Would recommed starting aspirin 81 mg daily for further prevention. Continue with echocardiogram as scheduled.

## 2023-08-07 NOTE — Telephone Encounter (Signed)
The patient has been notified of the result and verbalized understanding.  All   Pt aware to start taking ASA 81 mg po daily and proceed with scheduled echo on 9/16 and follow-up as planned.   Pt verbalized understanding and agrees with this plan.  ASA 81 mg po daily added to the pts med list.

## 2023-08-08 ENCOUNTER — Encounter: Payer: Self-pay | Admitting: Internal Medicine

## 2023-08-14 ENCOUNTER — Ambulatory Visit (HOSPITAL_COMMUNITY): Payer: Medicare HMO | Attending: Cardiology

## 2023-08-14 DIAGNOSIS — I10 Essential (primary) hypertension: Secondary | ICD-10-CM | POA: Insufficient documentation

## 2023-08-14 DIAGNOSIS — R002 Palpitations: Secondary | ICD-10-CM | POA: Diagnosis not present

## 2023-08-14 DIAGNOSIS — R072 Precordial pain: Secondary | ICD-10-CM | POA: Insufficient documentation

## 2023-08-14 DIAGNOSIS — R0602 Shortness of breath: Secondary | ICD-10-CM | POA: Insufficient documentation

## 2023-08-14 LAB — ECHOCARDIOGRAM COMPLETE
Area-P 1/2: 3.72 cm2
S' Lateral: 2.2 cm

## 2023-08-22 ENCOUNTER — Encounter: Payer: Self-pay | Admitting: Internal Medicine

## 2023-08-22 ENCOUNTER — Ambulatory Visit (AMBULATORY_SURGERY_CENTER): Payer: Medicare HMO | Admitting: Internal Medicine

## 2023-08-22 VITALS — BP 129/82 | HR 93 | Temp 97.6°F | Resp 13 | Ht 63.0 in | Wt 161.0 lb

## 2023-08-22 DIAGNOSIS — K219 Gastro-esophageal reflux disease without esophagitis: Secondary | ICD-10-CM

## 2023-08-22 DIAGNOSIS — I10 Essential (primary) hypertension: Secondary | ICD-10-CM | POA: Diagnosis not present

## 2023-08-22 DIAGNOSIS — Z09 Encounter for follow-up examination after completed treatment for conditions other than malignant neoplasm: Secondary | ICD-10-CM

## 2023-08-22 DIAGNOSIS — R1013 Epigastric pain: Secondary | ICD-10-CM

## 2023-08-22 DIAGNOSIS — E119 Type 2 diabetes mellitus without complications: Secondary | ICD-10-CM | POA: Diagnosis not present

## 2023-08-22 DIAGNOSIS — Z860100 Personal history of colon polyps, unspecified: Secondary | ICD-10-CM

## 2023-08-22 DIAGNOSIS — R197 Diarrhea, unspecified: Secondary | ICD-10-CM | POA: Diagnosis not present

## 2023-08-22 MED ORDER — SODIUM CHLORIDE 0.9 % IV SOLN: 500.0000 mL | Freq: Once | INTRAVENOUS | Status: DC

## 2023-08-22 NOTE — Op Note (Signed)
[facility] Patient Name: Rebecca Harmon Procedure Date: 08/22/2023 1:58 PM MRN: [id number] Endoscopist: Wilhemina Bonito. Marina Goodell , MD, [id number] Age: 77 Referring MD:  Date of Birth: 12-18-1945 Gender: Female Account #: [id number] Procedure:                Colonoscopy with biopsies Indications:              High risk colon cancer surveillance: Personal                            history of non-advanced adenoma, Incidental                            diarrhea noted. Previous examinations 2008 and 2016 Medicines:                Monitored Anesthesia Care Procedure:                Pre-Anesthesia Assessment:                           - Prior to the procedure, a History and Physical                            was performed, and patient medications and                            allergies were reviewed. The patient's tolerance of                            previous anesthesia was also reviewed. The risks                            and benefits of the procedure and the sedation                            options and risks were discussed with the patient.                            All questions were answered, and informed consent                            was obtained. Prior Anticoagulants: The patient has                            taken no anticoagulant or antiplatelet agents. ASA                            Grade Assessment: II - A patient with mild systemic                            disease. After reviewing the risks and benefits,                            the patient was deemed in satisfactory condition to  undergo the procedure.                           After obtaining informed consent, the colonoscope                            was passed under direct vision. Throughout the                            procedure, the patient's blood pressure, pulse, and                            oxygen saturations were monitored continuously. The                             Olympus Scope SN: [id number] was introduced through                            the anus and advanced to the the cecum, identified                            by appendiceal orifice and ileocecal valve. The                            ileocecal valve, appendiceal orifice, and rectum                            were photographed. The quality of the bowel                            preparation was excellent. The colonoscopy was                            performed without difficulty. The patient tolerated                            the procedure well. The bowel preparation used was                            SUPREP via split dose instruction. Scope In: 2:04:10 PM Scope Out: 2:14:02 PM Scope Withdrawal Time: 0 hours 7 minutes 21 seconds  Total Procedure Duration: 0 hours 9 minutes 52 seconds  Findings:                 A few diverticula were found in the sigmoid colon.                           The terminal ileum appeared normal.                           The exam was otherwise without abnormality on                            direct and retroflexion views.  Biopsies for histology were taken with a cold                            forceps from the entire colon for evaluation of                            microscopic colitis. Complications:            No immediate complications. Estimated blood loss:                            None. Estimated Blood Loss:     Estimated blood loss: none. Impression:               - Diverticulosis in the sigmoid colon.                           - The examined portion of the ileum was normal.                           - The examination was otherwise normal on direct                            and retroflexion views.                           - Biopsies were taken with a cold forceps from the                            entire colon for evaluation of microscopic colitis. Recommendation:           - Repeat colonoscopy is not recommended for                             surveillance.                           - Patient has a contact number available for                            emergencies. The signs and symptoms of potential                            delayed complications were discussed with the                            patient. Return to normal activities tomorrow.                            Written discharge instructions were provided to the                            patient.                           - Resume previous diet.                           -  Continue present medications.                           - Await pathology results.                           - If biopsies negative, consider metformin as a                            cause for chronic diarrhea. This can be addressed                            with her PCP Wilhemina Bonito. Marina Goodell, MD 08/22/2023 2:19:28 PM This report has been signed electronically.

## 2023-08-22 NOTE — Progress Notes (Unsigned)
Vss nad trans to pacu 

## 2023-08-22 NOTE — Progress Notes (Unsigned)
Called to room to assist during endoscopic procedure.  Patient ID and intended procedure confirmed with present staff. Received instructions for my participation in the procedure from the performing physician.  

## 2023-08-22 NOTE — Op Note (Signed)
[facility] Patient Name: Rebecca Harmon Procedure Date: 08/22/2023 1:55 PM MRN: [id number] Endoscopist: Wilhemina Bonito. Marina Goodell , MD, [id number] Age: 77 Referring MD:  Date of Birth: 09-18-1946 Gender: Female Account #: [id number] Procedure:                Upper GI endoscopy with biopsies Indications:              Epigastric abdominal pain, Esophageal reflux,                            Diarrhea Medicines:                Monitored Anesthesia Care Procedure:                Pre-Anesthesia Assessment:                           - Prior to the procedure, a History and Physical                            was performed, and patient medications and                            allergies were reviewed. The patient's tolerance of                            previous anesthesia was also reviewed. The risks                            and benefits of the procedure and the sedation                            options and risks were discussed with the patient.                            All questions were answered, and informed consent                            was obtained. Prior Anticoagulants: The patient has                            taken no anticoagulant or antiplatelet agents. ASA                            Grade Assessment: II - A patient with mild systemic                            disease. After reviewing the risks and benefits,                            the patient was deemed in satisfactory condition to                            undergo the procedure.  After obtaining informed consent, the endoscope was                            passed under direct vision. Throughout the                            procedure, the patient's blood pressure, pulse, and                            oxygen saturations were monitored continuously. The                            GIF [id number] #[id number] was introduced through the                            mouth, and advanced to the second  part of duodenum.                            The upper GI endoscopy was accomplished without                            difficulty. The patient tolerated the procedure                            well. Scope In: Scope Out: Findings:                 The esophagus was normal.                           The stomach was essentially normal. A very small                            hiatal hernia and a few diminutive benign fundic                            gland type polyps noted.                           The examined duodenum was normal. Biopsies for                            histology were taken with a cold forceps for                            evaluation of celiac disease or other potential                            causes for diarrhea.                           The cardia and gastric fundus were normal on                            retroflexion. Complications:  No immediate complications. Estimated Blood Loss:     Estimated blood loss: none. Impression:               1. Essentially normal EGD. Status post duodenal                            biopsies                           2. No cause for abdominal pain found. Recommendation:           - Patient has a contact number available for                            emergencies. The signs and symptoms of potential                            delayed complications were discussed with the                            patient. Return to normal activities tomorrow.                            Written discharge instructions were provided to the                            patient.                           - Resume previous diet.                           - Continue present medications.                           - Await pathology results.                           - PLEASE SCHEDULE contrast-enhanced CT scan of the                            abdomen and pelvis "persistent abdominal pain"                           - Further plans and follow-up  to be determined                            after the above Juanisha Bautch N. Marina Goodell, MD 08/22/2023 2:29:49 PM This report has been signed electronically.

## 2023-08-22 NOTE — Progress Notes (Unsigned)
Expand All Collapse All HISTORY OF PRESENT ILLNESS:   Rebecca Harmon is a 77 y.o. female with past medical history as listed below who presents today at the request of her primary care provider regarding chronic abdominal pain and diarrhea.   I last saw the patient in the office July 27, 2016 regarding chronic abdominal pain felt most likely secondary to adhesions or musculoskeletal etiology.  Prior extensive workups were negative.  She was also complaining of alternating bowel habits that point, on metformin.  Other issues were GERD, minor rectal bleeding, and a history of diminutive adenoma.  See that dictation for details.  She was continued on PPI for GERD.  Told to take Metamucil for alternating bowel habits.  And advised for surveillance colonoscopy around November 2021.  She has not been seen since.   Colonoscopy September 29, 2015.  Diminutive adenoma.  Otherwise normal. Upper endoscopy September 29, 2015.  Normal.   Patient tells me that she continues with upper abdominal pain.  She describes it in the epigastric region at times which is relieved with meals.  She also describes a second type of pain that occurs after straightening up from a bending posture, such as tying her shoes.  Finally, she mentioned a transient right lower cramping discomfort.  She tells me that she has diarrhea 7 days/week.  Currently on 2000 mg of metformin daily.  In terms of GERD, she tells me that her symptoms are nicely controlled with omeprazole.  When she comes off of omeprazole, significant GERD symptoms by day 3.  No dysphagia.  Her weight has been stable.  No bleeding.   Previous abdominal ultrasound and CT scan from 2017 reviewed.  No significant abnormalities.  She is status postcholecystectomy.   REVIEW OF SYSTEMS:   All non-GI ROS negative unless otherwise stated in the HPI except for seasonal allergies       Past Medical History:  Diagnosis Date   Allergy      SEASONAL   Chest pain 2-13 2012     echo EF 50-55% read by Dr. Tresa Endo pt. of Dr. Herbie Baltimore   Colon polyp      adenomatous   Diabetes mellitus without complication [facility])     Dizziness 03-06-2013    stress test Read by Dr. Allyson Sabal, normal stress test   Fatty liver     GERD (gastroesophageal reflux disease)     Hyperlipidemia     Hypertension                 Past Surgical History:  Procedure Laterality Date   ABDOMINAL HYSTERECTOMY       APPENDECTOMY       BREAST EXCISIONAL BIOPSY Left     CHOLECYSTECTOMY       COLONOSCOPY       THYROIDECTOMY, PARTIAL       TONSILLECTOMY        x2          Social History Rebecca Harmon  reports that she has quit smoking. She has never used smokeless tobacco. She reports that she does not drink alcohol and does not use drugs.   family history includes Diabetes in her mother and sister; Heart disease in her father.   Allergies       Allergies  Allergen Reactions   Codeine Other (See Comments)      dizzy   Tetanus Toxoid     Vytorin [Ezetimibe-Simvastatin] Other (See Comments)      UNSPECIFIED  PHYSICAL EXAMINATION: Vital signs: BP 122/80   Pulse 78   Ht 5\' 3"  (1.6 m)   Wt 161 lb (73 kg)   BMI 28.52 kg/m   Constitutional: generally well-appearing, no acute distress Psychiatric: alert and oriented x3, cooperative Eyes: extraocular movements intact, anicteric, conjunctiva pink Mouth: oral pharynx moist, no lesions Neck: supple no lymphadenopathy Cardiovascular: heart regular rate and rhythm, no murmur Lungs: clear to auscultation bilaterally Abdomen: soft, nontender, nondistended, no obvious ascites, no peritoneal signs, normal bowel sounds, no organomegaly Rectal: Deferred until colonoscopy Extremities: no clubbing, cyanosis, or lower extremity edema bilaterally Skin: no lesions on visible extremities Neuro: No focal deficits.  Cranial nerves intact   ASSESSMENT:   1.  Personal history of adenomatous colon polyp.  Due for surveillance 2.  Chronic  diarrhea.  Suspect metformin.  Rule out microscopic colitis.  Rule out sprue. 3.  Chronic GERD.  Require PPI to control symptoms 4.  Epigastric pain improved with meals.  Rule out ulcer. 5.  Chronic abdominal pain as described.  Seemingly similar to previous and remote evaluations.  Suspect musculoskeletal or adhesions 6.  Multiple medical problems including diabetes.  Stable     PLAN:   1.  Reflux precautions 2.  Continue omeprazole daily 3.  Schedule colonoscopy with biopsies for the purposes of surveillance and to evaluate diarrhea.The nature of the procedure, as well as the risks, benefits, and alternatives were carefully and thoroughly reviewed with the patient. Ample time for discussion and questions allowed. The patient understood, was satisfied, and agreed to proceed. 4.  Schedule upper endoscopy to evaluate epigastric pain affected by meals.  Consider duodenal biopsies.The nature of the procedure, as well as the risks, benefits, and alternatives were carefully and thoroughly reviewed with the patient. Ample time for discussion and questions allowed. The patient understood, was satisfied, and agreed to proceed. 5.  Patient states she has had difficulty with preps in the past regarding prep related nausea.  Will prescribe Zofran 4 mg to be taken 20 minutes before each prep session.

## 2023-08-22 NOTE — Patient Instructions (Signed)
Resume all of your previous medications today as ordered.  The office will call you to book the CT scan of your abdomen as no cause for your pain was found.  Read your handouts and discharge instructions.  YOU HAD AN ENDOSCOPIC PROCEDURE TODAY AT THE [facility]:   Refer to the procedure report that was given to you for any specific questions about what was found during the examination.  If the procedure report does not answer your questions, please call your gastroenterologist to clarify.  If you requested that your care partner not be given the details of your procedure findings, then the procedure report has been included in a sealed envelope for you to review at your convenience later.  YOU SHOULD EXPECT: Some feelings of bloating in the abdomen. Passage of more gas than usual.  Walking can help get rid of the air that was put into your GI tract during the procedure and reduce the bloating. If you had a lower endoscopy (such as a colonoscopy or flexible sigmoidoscopy) you may notice spotting of blood in your stool or on the toilet paper. If you underwent a bowel prep for your procedure, you may not have a normal bowel movement for a few days.  Please Note:  You might notice some irritation and congestion in your nose or some drainage.  This is from the oxygen used during your procedure.  There is no need for concern and it should clear up in a day or so.  SYMPTOMS TO REPORT IMMEDIATELY:  Following lower endoscopy (colonoscopy or flexible sigmoidoscopy):  Excessive amounts of blood in the stool  Significant tenderness or worsening of abdominal pains  Swelling of the abdomen that is new, acute  Fever of 100F or higher  Following upper endoscopy (EGD)  Vomiting of blood or coffee ground material  New chest pain or pain under the shoulder blades  Painful or persistently difficult swallowing  New shortness of breath  Fever of 100F or higher  Black, tarry-looking stools  For  urgent or emergent issues, a gastroenterologist can be reached at any hour by calling [phone]. Do not use MyChart messaging for urgent concerns.    DIET:  We do recommend a small meal at first, but then you may proceed to your regular diet.  Drink plenty of fluids but you should avoid alcoholic beverages for 24 hours.  ACTIVITY:  You should plan to take it easy for the rest of today and you should NOT DRIVE or use heavy machinery until tomorrow (because of the sedation medicines used during the test).    FOLLOW UP: Our staff will call the number listed on your records the next business day following your procedure.  We will call around 7:15- 8:00 am to check on you and address any questions or concerns that you may have regarding the information given to you following your procedure. If we do not reach you, we will leave a message.     If any biopsies were taken you will be contacted by phone or by letter within the next 1-3 weeks.  Please call us at [phone] if you have not heard about the biopsies in 3 weeks.    SIGNATURES/CONFIDENTIALITY: You and/or your care partner have signed paperwork which will be entered into your electronic medical record.  These signatures attest to the fact that that the information above on your After Visit Summary has been reviewed and is understood.  Full responsibility of the confidentiality of this  discharge information lies with you and/or your care-partner.

## 2023-08-23 ENCOUNTER — Telehealth: Payer: Self-pay

## 2023-08-23 NOTE — Telephone Encounter (Signed)
  Follow up Call-     08/22/2023    1:07 PM  Call back number  Post procedure Call Back phone  # [phone]  Permission to leave phone message Yes     Patient questions:  Do you have a fever, pain , or abdominal swelling? No. Pain Score  0 *  Have you tolerated food without any problems? Yes.    Have you been able to return to your normal activities? Yes.    Do you have any questions about your discharge instructions: Diet   No. Medications  No. Follow up visit  No.  Do you have questions or concerns about your Care? No.  Actions: * If pain score is 4 or above: No action needed, pain <4.

## 2023-08-28 ENCOUNTER — Encounter: Payer: Self-pay | Admitting: Internal Medicine

## 2023-08-28 ENCOUNTER — Other Ambulatory Visit: Payer: Self-pay

## 2023-08-28 DIAGNOSIS — R1084 Generalized abdominal pain: Secondary | ICD-10-CM

## 2023-08-28 LAB — SURGICAL PATHOLOGY

## 2023-08-31 ENCOUNTER — Telehealth: Payer: Self-pay | Admitting: Internal Medicine

## 2023-08-31 NOTE — Telephone Encounter (Signed)
Inbound call from patient in regards to previous note. Please advise.  Thank you

## 2023-08-31 NOTE — Telephone Encounter (Signed)
CT Abd Pel W Contrast Hello, this pt's CT scan scheduled for tomorrow 09/01/23 is still under medical review after faxing all of the clinical documentation. I have left a msg on the pt's vm informing her of this information.

## 2023-09-01 ENCOUNTER — Ambulatory Visit (HOSPITAL_COMMUNITY): Payer: Medicare HMO

## 2023-09-01 ENCOUNTER — Encounter (HOSPITAL_COMMUNITY): Payer: Self-pay

## 2023-09-04 NOTE — Telephone Encounter (Signed)
Do I need to do anything with this?

## 2023-09-04 NOTE — Telephone Encounter (Signed)
Ok thanks 

## 2023-10-06 ENCOUNTER — Telehealth: Payer: Self-pay | Admitting: Internal Medicine

## 2023-10-06 NOTE — Telephone Encounter (Signed)
Inbound call from patient wishing to discuss scheduling for CT that was approved by insurance. Please advise, thank you

## 2023-10-16 DIAGNOSIS — R8290 Unspecified abnormal findings in urine: Secondary | ICD-10-CM | POA: Diagnosis not present

## 2023-10-16 NOTE — Telephone Encounter (Signed)
Lm on vm that patient could call me back for help scheduling CT or she could call imaging directly.

## 2023-10-19 ENCOUNTER — Ambulatory Visit (HOSPITAL_COMMUNITY)
Admission: RE | Admit: 2023-10-19 | Discharge: 2023-10-19 | Disposition: A | Discharge status: Home / Self Care | Payer: Medicare HMO | Source: Ambulatory Visit | Attending: Internal Medicine | Admitting: Internal Medicine

## 2023-10-19 DIAGNOSIS — R1084 Generalized abdominal pain: Secondary | ICD-10-CM | POA: Diagnosis not present

## 2023-10-19 DIAGNOSIS — K760 Fatty (change of) liver, not elsewhere classified: Secondary | ICD-10-CM | POA: Diagnosis not present

## 2023-10-19 DIAGNOSIS — R109 Unspecified abdominal pain: Secondary | ICD-10-CM | POA: Diagnosis not present

## 2023-10-19 DIAGNOSIS — N200 Calculus of kidney: Secondary | ICD-10-CM | POA: Diagnosis not present

## 2023-10-19 MED ORDER — IOHEXOL 300 MG/ML  SOLN
100.0000 mL | Freq: Once | INTRAMUSCULAR | Status: AC | PRN
  Administered 2023-10-19: 100 mL via INTRAVENOUS

## 2023-10-19 MED ORDER — IOHEXOL 300 MG/ML  SOLN
30.0000 mL | Freq: Once | INTRAMUSCULAR | Status: AC | PRN
  Administered 2023-10-19: 30 mL via ORAL

## 2023-10-30 DIAGNOSIS — E119 Type 2 diabetes mellitus without complications: Secondary | ICD-10-CM | POA: Diagnosis not present

## 2023-10-30 DIAGNOSIS — Z0100 Encounter for examination of eyes and vision without abnormal findings: Secondary | ICD-10-CM | POA: Diagnosis not present

## 2023-10-30 DIAGNOSIS — H52223 Regular astigmatism, bilateral: Secondary | ICD-10-CM | POA: Diagnosis not present

## 2023-10-30 DIAGNOSIS — Z9849 Cataract extraction status, unspecified eye: Secondary | ICD-10-CM | POA: Diagnosis not present

## 2023-10-30 DIAGNOSIS — H5212 Myopia, left eye: Secondary | ICD-10-CM | POA: Diagnosis not present

## 2023-10-30 DIAGNOSIS — Z961 Presence of intraocular lens: Secondary | ICD-10-CM | POA: Diagnosis not present

## 2023-10-30 DIAGNOSIS — H53143 Visual discomfort, bilateral: Secondary | ICD-10-CM | POA: Diagnosis not present

## 2023-10-30 DIAGNOSIS — H43393 Other vitreous opacities, bilateral: Secondary | ICD-10-CM | POA: Diagnosis not present

## 2023-12-13 DIAGNOSIS — E1121 Type 2 diabetes mellitus with diabetic nephropathy: Secondary | ICD-10-CM | POA: Diagnosis not present

## 2023-12-13 DIAGNOSIS — I129 Hypertensive chronic kidney disease with stage 1 through stage 4 chronic kidney disease, or unspecified chronic kidney disease: Secondary | ICD-10-CM | POA: Diagnosis not present

## 2023-12-13 DIAGNOSIS — R42 Dizziness and giddiness: Secondary | ICD-10-CM | POA: Diagnosis not present

## 2023-12-13 DIAGNOSIS — N183 Chronic kidney disease, stage 3 unspecified: Secondary | ICD-10-CM | POA: Diagnosis not present

## 2023-12-13 DIAGNOSIS — R112 Nausea with vomiting, unspecified: Secondary | ICD-10-CM | POA: Diagnosis not present

## 2024-01-04 DIAGNOSIS — J101 Influenza due to other identified influenza virus with other respiratory manifestations: Secondary | ICD-10-CM | POA: Diagnosis not present

## 2024-01-04 DIAGNOSIS — E785 Hyperlipidemia, unspecified: Secondary | ICD-10-CM | POA: Diagnosis not present

## 2024-01-04 DIAGNOSIS — I129 Hypertensive chronic kidney disease with stage 1 through stage 4 chronic kidney disease, or unspecified chronic kidney disease: Secondary | ICD-10-CM | POA: Diagnosis not present

## 2024-01-04 DIAGNOSIS — E1121 Type 2 diabetes mellitus with diabetic nephropathy: Secondary | ICD-10-CM | POA: Diagnosis not present

## 2024-01-04 DIAGNOSIS — J988 Other specified respiratory disorders: Secondary | ICD-10-CM | POA: Diagnosis not present

## 2024-04-17 DIAGNOSIS — R8290 Unspecified abnormal findings in urine: Secondary | ICD-10-CM | POA: Diagnosis not present

## 2024-04-17 DIAGNOSIS — M654 Radial styloid tenosynovitis [de Quervain]: Secondary | ICD-10-CM | POA: Diagnosis not present

## 2024-04-17 DIAGNOSIS — M65342 Trigger finger, left ring finger: Secondary | ICD-10-CM | POA: Diagnosis not present

## 2024-05-15 DIAGNOSIS — N179 Acute kidney failure, unspecified: Secondary | ICD-10-CM | POA: Diagnosis not present

## 2024-05-15 DIAGNOSIS — R42 Dizziness and giddiness: Secondary | ICD-10-CM | POA: Diagnosis not present

## 2024-09-03 DIAGNOSIS — J069 Acute upper respiratory infection, unspecified: Secondary | ICD-10-CM | POA: Diagnosis not present

## 2024-09-03 DIAGNOSIS — R300 Dysuria: Secondary | ICD-10-CM | POA: Diagnosis not present

## 2024-09-03 DIAGNOSIS — E1121 Type 2 diabetes mellitus with diabetic nephropathy: Secondary | ICD-10-CM | POA: Diagnosis not present

## 2024-11-01 ENCOUNTER — Telehealth: Payer: Self-pay | Admitting: Cardiovascular Disease

## 2024-11-01 ENCOUNTER — Ambulatory Visit: Attending: Cardiovascular Disease | Admitting: Cardiovascular Disease

## 2024-11-01 ENCOUNTER — Encounter: Payer: Self-pay | Admitting: Cardiovascular Disease

## 2024-11-01 ENCOUNTER — Ambulatory Visit

## 2024-11-01 VITALS — BP 140/76 | HR 73 | Ht 63.0 in | Wt 153.0 lb

## 2024-11-01 DIAGNOSIS — I10 Essential (primary) hypertension: Secondary | ICD-10-CM | POA: Diagnosis not present

## 2024-11-01 DIAGNOSIS — R0602 Shortness of breath: Secondary | ICD-10-CM

## 2024-11-01 DIAGNOSIS — R002 Palpitations: Secondary | ICD-10-CM

## 2024-11-01 NOTE — Progress Notes (Unsigned)
 Enrolled patient for a 14 day Zio XT  monitor to be mailed to patients home

## 2024-11-01 NOTE — Patient Instructions (Addendum)
 Medication Instructions:  Your physician recommends that you continue on your current medications as directed. Please refer to the Current Medication list given to you today.  *If you need a refill on your cardiac medications before your next appointment, please call your pharmacy*  Lab Work:  TSH TODAY  If you have labs (blood work) drawn today and your tests are completely normal, you will receive your results only by: MyChart Message (if you have MyChart) OR A paper copy in the mail If you have any lab test that is abnormal or we need to change your treatment, we will call you to review the results.  Testing/Procedures: 2 Week Heart Monitor will be mailed to your home   Follow-Up: At [facility], you and your health needs are our priority.  As part of our continuing mission to provide you with exceptional heart care, our providers are all part of one team.  This team includes your primary Cardiologist (physician) and Advanced Practice Providers or APPs (Physician Assistants and Nurse Practitioners) who all work together to provide you with the care you need, when you need it.  Your next appointment:   6 months follow up call in April to schedule June appointment    Provider:   PA  Schedule 1 year appointment with Dr. Barbaraann Call in August to schedule a December appointment  We recommend signing up for the patient portal called MyChart.  Sign up information is provided on this After Visit Summary.  MyChart is used to connect with patients for Virtual Visits (Telemedicine).  Patients are able to view lab/test results, encounter notes, upcoming appointments, etc.  Non-urgent messages can be sent to your provider as well.   To learn more about what you can do with MyChart, go to [email or web address].

## 2024-11-01 NOTE — Progress Notes (Signed)
 Cardiology Office Note:  .   Date:  11/01/2024  ID:  Rebecca Harmon, DOB 1945-12-06, MRN [id number] PCP: Signa Dire, MD (Inactive)  [facility] Providers Cardiologist:  None   History of Present Illness: .    Chief Complaint  Patient presents with   Palpitations    Rebecca Harmon is a 78 y.o. female with below history who presents for follow-up.    History of Present Illness   Rebecca Harmon is a 78 year old female with supraventricular tachycardia who presents with palpitations.  She experiences episodes of palpitations characterized by a pounding heartbeat, occurring approximately once a week to once every two weeks. These episodes last for a few minutes and resolve spontaneously. No identifiable triggers are noted. She has a history of supraventricular tachycardia and previously wore a heart monitor three years ago, which showed brief extra beats. A CT scan and heart ultrasound performed in the past showed no significant heart blockages. She is currently taking metoprolol  succinate 100 mg daily, aspirin , and Crestor 5 mg daily. No history of heart attack or stroke is reported.  She experienced chest discomfort last night or early this morning, described as being to the left of the chest and around the side, with a 'funny' feeling down the arm but not pain. Mild breathlessness was also noted, but not gasping. She denies stress.  She has a history of diabetes, hypertension, and hyperlipidemia. She denies smoking and alcohol use. She is retired, married, with two children, two grandchildren, and four great-grandchildren. She reports difficulty with water intake, stating that water makes her nauseous and causes stomach discomfort. She drinks milk, one cup of coffee a day, tea, diet soda, and occasionally Gatorade. Her sleep pattern is inconsistent, with good sleep for a week followed by a week of poor sleep due to 'thoughts and turns'. She acknowledges not watching  her salt intake and not engaging in regular exercise beyond walking her dog.           Problem List DM HTN HLD -T chol 131, HDL 39, LDL 55, TG 232 Non-obstructive CAD -CAC 35 (37th percentile) -mild CAD (25-49%) 5. SVT -brief episodes 2022    ROS: All other ROS reviewed and negative. Pertinent positives noted in the HPI.     Studies Reviewed: SABRA   EKG Interpretation Date/Time:  Friday November 01 2024 13:38:56 EST Ventricular Rate:  73 PR Interval:  142 QRS Duration:  66 QT Interval:  408 QTC Calculation: 449 R Axis:   1  Text Interpretation: Normal sinus rhythm Normal ECG Confirmed by Barbaraann Kotyk [phone]) on 11/01/2024 1:40:32 PM   CCTA 08/14/2023 IMPRESSION: 1. Coronary calcium score of 35. This was 37th percentile for age, sex, and race matched control. Total plaque volume of 102 cubic mm, this is above the 10th percentile for age and gender matched control.   2. Normal coronary origin with right dominance.   3. CAD-RADS 2. Mild non-obstructive CAD (25-49%). Consider non-atherosclerotic causes of chest pain. Consider preventive therapy and risk factor modification.  TTE 08/14/2023  1. Left ventricular ejection fraction, by estimation, is 55 to 60%. Left  ventricular ejection fraction by 3D volume is 57 %. The left ventricle has  normal function. The left ventricle has no regional wall motion  abnormalities. Left ventricular diastolic   parameters are consistent with Grade I diastolic dysfunction (impaired  relaxation). The average left ventricular global longitudinal strain is  -18.8 %. The global longitudinal strain is normal.  2. Right ventricular systolic function is normal. The right ventricular  size is normal.   3. The mitral valve is normal in structure. Mild mitral valve  regurgitation. No evidence of mitral stenosis.   4. The aortic valve is normal in structure. Aortic valve regurgitation is  not visualized. No aortic stenosis is present.   5. The  inferior vena cava is normal in size with greater than 50%  respiratory variability, suggesting right atrial pressure of 3 mmHg.   Physical Exam:   VS:  BP (!) 140/76   Pulse 73   Ht 5' 3 (1.6 m)   Wt 153 lb (69.4 kg)   SpO2 98%   BMI 27.10 kg/m    Wt Readings from Last 3 Encounters:  11/01/24 153 lb (69.4 kg)  08/22/23 161 lb (73 kg)  07/27/23 161 lb (73 kg)    GEN: Well nourished, well developed in no acute distress NECK: No JVD; No carotid bruits CARDIAC: RRR, no murmurs, rubs, gallops RESPIRATORY:  Clear to auscultation without rales, wheezing or rhonchi  ABDOMEN: Soft, non-tender, non-distended EXTREMITIES:  No edema; No deformity  ASSESSMENT AND PLAN: .   Assessment and Plan    Supraventricular tachycardia with palpitations Intermittent palpitations occur weekly, lasting several minutes, resolving spontaneously. EKG normal. Dehydration and poor sleep may contribute. Nonobstructive coronary artery disease confirmed by CT. Chest discomfort non-cardiac. - Ordered two-week heart monitor. - Encouraged increased fluid intake. - Advised on sleep hygiene. - Continue metoprolol  succinate 100 mg daily. - Checked TSH.  Nonobstructive coronary artery disease Recent CT confirmed nonobstructive coronary artery disease. Chest discomfort non-cardiac. - Continue current management.  Essential hypertension Blood pressure managed with losartan. - Continue losartan 50 mg daily.  Hyperlipidemia LDL well-controlled at 55 mg/dL with Crestor. - Continue Crestor 5 mg daily.  Type 2 diabetes mellitus                  Follow-up: Return in about 6 months (around 05/02/2025).  Signed, Darryle DASEN. Barbaraann, MD, [facility]  [facility]  [address] [place], [place] [address] [phone]  3:05 PM

## 2024-11-01 NOTE — Telephone Encounter (Signed)
 Patient c/o Palpitations:  STAT if patient reporting lightheadedness, shortness of breath, or chest pain  How long have you had palpitations/irregular HR/ Afib? Are you having the symptoms now? Just started, no   Are you currently experiencing lightheadedness, SOB or CP? No   Do you have a history of afib (atrial fibrillation) or irregular heart rhythm? No   Have you checked your BP or HR? (document readings if available): unknown   Are you experiencing any other symptoms? Pt states she feels like her heart is doing a extra beat and would like a c/b from a nurse please advise

## 2024-11-01 NOTE — Telephone Encounter (Signed)
 Patient reports for the past few weeks she has had intermittent palpitations. She describes as feeling her heart beat harder than usual.   She denies any SOB or chest pain. She states she did wake up this morning with a pain in her left side (not in chest area) that concerned her.  She states she has also been experiencing vertigo the last few days. She does not really drink any water, just milk/coffee/tea/diet soda. Denies any decrease in oral fluid intake.  Patient is overdue for follow-up, previously saw Dr. Hobart. Offered open appt time with DOD Dr. Barbaraann today at 1:40 PM. Patient accepted and will come in for evaluation of symptoms.

## 2024-11-02 ENCOUNTER — Ambulatory Visit: Payer: Self-pay | Admitting: Cardiovascular Disease

## 2024-11-02 LAB — TSH: TSH: 1.89 u[IU]/mL (ref 0.450–4.500)

## 2024-12-01 DIAGNOSIS — R002 Palpitations: Secondary | ICD-10-CM

## 2024-12-01 DIAGNOSIS — R0602 Shortness of breath: Secondary | ICD-10-CM

## 2024-12-01 DIAGNOSIS — I10 Essential (primary) hypertension: Secondary | ICD-10-CM | POA: Diagnosis not present
